# Patient Record
Sex: Female | Born: 1955 | Race: Black or African American | Hispanic: No | Marital: Married | Smoking: Former smoker
Health system: Southern US, Community
[De-identification: ages and names within clinical notes are randomized; demographics above are authoritative.]

## PROBLEM LIST (undated history)

## (undated) DIAGNOSIS — R918 Other nonspecific abnormal finding of lung field: Secondary | ICD-10-CM

## (undated) DIAGNOSIS — Z789 Other specified health status: Secondary | ICD-10-CM

## (undated) DIAGNOSIS — F17200 Nicotine dependence, unspecified, uncomplicated: Secondary | ICD-10-CM

## (undated) DIAGNOSIS — D1720 Benign lipomatous neoplasm of skin and subcutaneous tissue of unspecified limb: Secondary | ICD-10-CM

## (undated) DIAGNOSIS — I10 Essential (primary) hypertension: Secondary | ICD-10-CM

## (undated) DIAGNOSIS — F1410 Cocaine abuse, uncomplicated: Secondary | ICD-10-CM

## (undated) DIAGNOSIS — J40 Bronchitis, not specified as acute or chronic: Secondary | ICD-10-CM

## (undated) DIAGNOSIS — I499 Cardiac arrhythmia, unspecified: Secondary | ICD-10-CM

## (undated) DIAGNOSIS — Z5189 Encounter for other specified aftercare: Secondary | ICD-10-CM

## (undated) DIAGNOSIS — J449 Chronic obstructive pulmonary disease, unspecified: Secondary | ICD-10-CM

## (undated) DIAGNOSIS — R51 Headache: Secondary | ICD-10-CM

## (undated) DIAGNOSIS — M25519 Pain in unspecified shoulder: Secondary | ICD-10-CM

## (undated) DIAGNOSIS — M1990 Unspecified osteoarthritis, unspecified site: Secondary | ICD-10-CM

## (undated) DIAGNOSIS — Z720 Tobacco use: Secondary | ICD-10-CM

## (undated) DIAGNOSIS — E785 Hyperlipidemia, unspecified: Secondary | ICD-10-CM

## (undated) HISTORY — DX: Chronic obstructive pulmonary disease, unspecified: J44.9

## (undated) HISTORY — PX: FRACTURE SURGERY: SHX138

## (undated) HISTORY — PX: UTERINE FIBROID SURGERY: SHX826

## (undated) HISTORY — DX: Nicotine dependence, unspecified, uncomplicated: F17.200

---

## 2001-08-08 ENCOUNTER — Emergency Department (HOSPITAL_COMMUNITY): Admission: EM | Admit: 2001-08-08 | Discharge: 2001-08-08 | Payer: Self-pay | Admitting: Emergency Medicine

## 2002-03-08 ENCOUNTER — Inpatient Hospital Stay (HOSPITAL_COMMUNITY): Admission: EM | Admit: 2002-03-08 | Discharge: 2002-03-08 | Payer: Self-pay | Admitting: *Deleted

## 2002-03-08 ENCOUNTER — Encounter (INDEPENDENT_AMBULATORY_CARE_PROVIDER_SITE_OTHER): Payer: Self-pay | Admitting: *Deleted

## 2002-03-08 ENCOUNTER — Encounter: Payer: Self-pay | Admitting: *Deleted

## 2003-01-16 ENCOUNTER — Emergency Department (HOSPITAL_COMMUNITY): Admission: EM | Admit: 2003-01-16 | Discharge: 2003-01-16 | Payer: Self-pay

## 2004-10-09 ENCOUNTER — Ambulatory Visit: Payer: Self-pay | Admitting: Internal Medicine

## 2004-10-09 ENCOUNTER — Inpatient Hospital Stay (HOSPITAL_COMMUNITY): Admission: EM | Admit: 2004-10-09 | Discharge: 2004-10-11 | Payer: Self-pay | Admitting: Emergency Medicine

## 2004-10-20 ENCOUNTER — Ambulatory Visit: Payer: Self-pay | Admitting: Internal Medicine

## 2004-11-03 ENCOUNTER — Ambulatory Visit: Payer: Self-pay | Admitting: Internal Medicine

## 2004-11-18 ENCOUNTER — Ambulatory Visit: Payer: Self-pay | Admitting: Internal Medicine

## 2004-12-20 ENCOUNTER — Ambulatory Visit: Payer: Self-pay | Admitting: Internal Medicine

## 2004-12-30 ENCOUNTER — Ambulatory Visit: Payer: Self-pay | Admitting: Internal Medicine

## 2005-01-13 ENCOUNTER — Ambulatory Visit: Payer: Self-pay | Admitting: Internal Medicine

## 2005-03-10 ENCOUNTER — Ambulatory Visit: Payer: Self-pay | Admitting: Internal Medicine

## 2005-03-24 ENCOUNTER — Ambulatory Visit: Payer: Self-pay | Admitting: Internal Medicine

## 2005-09-06 ENCOUNTER — Ambulatory Visit: Payer: Self-pay | Admitting: Internal Medicine

## 2005-09-12 ENCOUNTER — Ambulatory Visit: Payer: Self-pay | Admitting: Hospitalist

## 2005-10-26 ENCOUNTER — Emergency Department (HOSPITAL_COMMUNITY): Admission: EM | Admit: 2005-10-26 | Discharge: 2005-10-26 | Payer: Self-pay | Admitting: Emergency Medicine

## 2006-04-17 DIAGNOSIS — I10 Essential (primary) hypertension: Secondary | ICD-10-CM | POA: Insufficient documentation

## 2006-04-17 DIAGNOSIS — F17200 Nicotine dependence, unspecified, uncomplicated: Secondary | ICD-10-CM

## 2006-04-17 DIAGNOSIS — F1420 Cocaine dependence, uncomplicated: Secondary | ICD-10-CM | POA: Insufficient documentation

## 2006-04-17 DIAGNOSIS — N951 Menopausal and female climacteric states: Secondary | ICD-10-CM | POA: Insufficient documentation

## 2006-04-17 HISTORY — DX: Nicotine dependence, unspecified, uncomplicated: F17.200

## 2009-01-26 ENCOUNTER — Emergency Department (HOSPITAL_COMMUNITY): Admission: EM | Admit: 2009-01-26 | Discharge: 2009-01-27 | Payer: Self-pay | Admitting: Emergency Medicine

## 2010-07-18 ENCOUNTER — Emergency Department (HOSPITAL_COMMUNITY)
Admission: EM | Admit: 2010-07-18 | Discharge: 2010-07-18 | Payer: Self-pay | Source: Home / Self Care | Admitting: Emergency Medicine

## 2010-07-19 LAB — POCT I-STAT, CHEM 8
Creatinine, Ser: 1.3 mg/dL — ABNORMAL HIGH (ref 0.4–1.2)
HCT: 53 % — ABNORMAL HIGH (ref 36.0–46.0)
Hemoglobin: 18 g/dL — ABNORMAL HIGH (ref 12.0–15.0)
Potassium: 3.3 mEq/L — ABNORMAL LOW (ref 3.5–5.1)
Sodium: 141 mEq/L (ref 135–145)
TCO2: 30 mmol/L (ref 0–100)

## 2010-07-19 LAB — URINALYSIS, ROUTINE W REFLEX MICROSCOPIC
Bilirubin Urine: NEGATIVE
Hgb urine dipstick: NEGATIVE
Nitrite: NEGATIVE
Protein, ur: NEGATIVE mg/dL
Specific Gravity, Urine: 1.008 (ref 1.005–1.030)
Urobilinogen, UA: 0.2 mg/dL (ref 0.0–1.0)

## 2010-07-19 LAB — URINE MICROSCOPIC-ADD ON

## 2010-10-01 LAB — POCT I-STAT, CHEM 8
BUN: 15 mg/dL (ref 6–23)
Creatinine, Ser: 1.1 mg/dL (ref 0.4–1.2)
Glucose, Bld: 131 mg/dL — ABNORMAL HIGH (ref 70–99)
Hemoglobin: 16.7 g/dL — ABNORMAL HIGH (ref 12.0–15.0)
Potassium: 2.8 mEq/L — ABNORMAL LOW (ref 3.5–5.1)
Sodium: 140 mEq/L (ref 135–145)
TCO2: 23 mmol/L (ref 0–100)

## 2010-11-11 NOTE — Discharge Summary (Signed)
   NAME:  Margaret Mcmillan, Margaret Mcmillan                          ACCOUNT NO.:  [id number]   MEDICAL RECORD NO.:  [id number]                   PATIENT TYPE:  INP   LOCATION:  9180                                 FACILITY:  [facility]   PHYSICIAN:  Katy Fitch, MD                 DATE OF BIRTH:  10/26/1955   DATE OF ADMISSION:  03/08/2002  DATE OF DISCHARGE:  03/08/2002                                 DISCHARGE SUMMARY   ADMITTING DIAGNOSES:  1. Vaginal bleeding.  2. Anemia.  3. Cervical mass suspicious for neoplasm.   DISCHARGE DIAGNOSES:  1. Vaginal bleeding.  2. Anemia.  3. Cervical mass suspicious for neoplasm.   FOLLOW-UP:  With the surgeons at [facility].   DIET:  N.p.o. at the time of discharge.   HOSPITAL COURSE:  The patient was seen in the emergency room at [facility] on the  morning of March 08, 2002 and noted to have a hemoglobin level of 4.2.  The patient states she had been bleeding for several weeks and has not been  seen by a doctor for a long time.  The patient was transferred to [facility] and upon examination the patient was noted to have a protruding  mass from her cervix.  Then, cells were taken for evaluation under  microscope.  After doing frozen section with the pathologist it was noted  that there were cells that looked atypical and suspicious for carcinoma.  At  this point the patient was explained this and she had already started having  transfusion of 2 units of blood.  The patient's course and history were  discussed with the people at [facility] and it was felt that she would  be best served by having a gynecological oncologist see the patient due to  her anemic state and possibility of having a cancerous lesion coming from  her cervix.  The patient was accepted in transfer and was sent to [facility] for evaluation and surgery.  The patient can follow up with me in the  office if needed.  The patient is to follow up with the people at [facility].   ACTIVITY:  Regular.                                               Katy Fitch, MD    DC/MEDQ  D:  04/23/2002  T:  04/24/2002  Job:  [id number]

## 2010-11-11 NOTE — H&P (Signed)
NAME:  Margaret Mcmillan, Margaret Mcmillan                          ACCOUNT NO.:  [id number]   MEDICAL RECORD NO.:  [id number]                   PATIENT TYPE:  MAT   LOCATION:  MATC                                 FACILITY:  [facility]   PHYSICIAN:  Devin M. Ciliberti, M.D.            DATE OF BIRTH:  04-18-1956   DATE OF ADMISSION:  03/08/2002  DATE OF DISCHARGE:                                HISTORY & PHYSICAL   HISTORY OF PRESENT ILLNESS:  The patient is a 55 year old G4 P4 who  presented to [facility] ER complaining of heavy vaginal bleeding which started  approximately a week ago.  The patient showed up at the ER after having  continuous bleeding and feeling lightheaded.  The patient, checked in the  ER, was noted to be orthostatic and a hemoglobin of 4.2.  The patient states  she has had normal regular periods throughout her life and this is the first  time she has ever had a heavy bleed like this.  The patient had a  coagulation profile checked that was also normal.  The patient is not  sexually active for over a year.  The patient has had no other complaints  including loss of consciousness, fevers, chest pain, or change of bowel or  bladder habits.   PAST MEDICAL HISTORY:  Hypertension.   PAST SURGICAL HISTORY:  None.   MEDICATIONS:  None.   ALLERGIES:  None.   SOCIAL HISTORY:  The patient is a half-a-pack per day smoker.   FAMILY HISTORY:  No family history of epithelial cancers.   PHYSICAL EXAMINATION:  VITAL SIGNS:  Blood pressure 144/90, pulse 103.  HEENT:  Throat clear.  LUNGS:  Clear to auscultation bilaterally.  HEART:  Regular rate and rhythm.  ABDOMEN:  Soft, nontender.  PELVIC:  Pelvic mass felt in the vaginal.  On speculum exam there is a  protruding mass coming out of the cervix of which tissue was taken.  This  could either represent a pedunculated fibroid or cervical polyp, or  potentially cervical CA.   PLAN:  The patient is already on her second unit of packed red blood cells.  She will get a CBC after her second unit of transfusion.  Will send part of  the tissue from her cervical mass to get frozen section.  If this is cancer  we will probably transport the patient to a tertiary care center with a GYN  oncologist; however, if this is not we will stabilize the patient and then  arrange for her to have a surgical exploration of this mass.  The patient  was explained this and will be followed up as soon as her labs and pathology  are back.  Devin M. Ciliberti, M.D.    DMC/MEDQ  D:  03/08/2002  T:  03/08/2002  Job:  [id number]

## 2011-03-14 ENCOUNTER — Emergency Department (HOSPITAL_COMMUNITY): Payer: Self-pay

## 2011-03-14 ENCOUNTER — Emergency Department (HOSPITAL_COMMUNITY)
Admission: EM | Admit: 2011-03-14 | Discharge: 2011-03-14 | Disposition: A | Discharge status: Home / Self Care | Payer: Self-pay | Attending: Emergency Medicine | Admitting: Emergency Medicine

## 2011-03-14 DIAGNOSIS — D170 Benign lipomatous neoplasm of skin and subcutaneous tissue of head, face and neck: Secondary | ICD-10-CM | POA: Insufficient documentation

## 2011-03-14 DIAGNOSIS — M7989 Other specified soft tissue disorders: Secondary | ICD-10-CM | POA: Insufficient documentation

## 2011-03-14 DIAGNOSIS — M25519 Pain in unspecified shoulder: Secondary | ICD-10-CM | POA: Insufficient documentation

## 2011-03-14 DIAGNOSIS — M79609 Pain in unspecified limb: Secondary | ICD-10-CM | POA: Insufficient documentation

## 2011-03-14 DIAGNOSIS — I10 Essential (primary) hypertension: Secondary | ICD-10-CM | POA: Insufficient documentation

## 2011-03-14 LAB — POCT I-STAT, CHEM 8
Calcium, Ion: 1.22 mmol/L (ref 1.12–1.32)
Chloride: 103 mEq/L (ref 96–112)
Creatinine, Ser: 1.1 mg/dL (ref 0.50–1.10)
Glucose, Bld: 82 mg/dL (ref 70–99)
Potassium: 3.6 mEq/L (ref 3.5–5.1)

## 2011-04-10 ENCOUNTER — Emergency Department (HOSPITAL_COMMUNITY)
Admission: EM | Admit: 2011-04-10 | Discharge: 2011-04-10 | Disposition: A | Discharge status: Home / Self Care | Payer: Self-pay | Attending: Emergency Medicine | Admitting: Emergency Medicine

## 2011-04-10 DIAGNOSIS — M25529 Pain in unspecified elbow: Secondary | ICD-10-CM | POA: Insufficient documentation

## 2011-04-10 DIAGNOSIS — F17200 Nicotine dependence, unspecified, uncomplicated: Secondary | ICD-10-CM | POA: Insufficient documentation

## 2011-04-10 DIAGNOSIS — M79609 Pain in unspecified limb: Secondary | ICD-10-CM | POA: Insufficient documentation

## 2011-04-10 DIAGNOSIS — G8929 Other chronic pain: Secondary | ICD-10-CM | POA: Insufficient documentation

## 2011-04-10 DIAGNOSIS — M25519 Pain in unspecified shoulder: Secondary | ICD-10-CM | POA: Insufficient documentation

## 2011-04-10 DIAGNOSIS — I10 Essential (primary) hypertension: Secondary | ICD-10-CM | POA: Insufficient documentation

## 2011-06-03 ENCOUNTER — Encounter: Payer: Self-pay | Admitting: *Deleted

## 2011-06-03 ENCOUNTER — Inpatient Hospital Stay (HOSPITAL_COMMUNITY)
Admission: EM | Admit: 2011-06-03 | Discharge: 2011-06-05 | DRG: 305 | Disposition: A | Discharge status: Home / Self Care | Payer: Self-pay | Attending: Internal Medicine | Admitting: Internal Medicine

## 2011-06-03 ENCOUNTER — Emergency Department (HOSPITAL_COMMUNITY): Payer: Self-pay

## 2011-06-03 ENCOUNTER — Other Ambulatory Visit: Payer: Self-pay

## 2011-06-03 DIAGNOSIS — F1420 Cocaine dependence, uncomplicated: Secondary | ICD-10-CM

## 2011-06-03 DIAGNOSIS — R279 Unspecified lack of coordination: Secondary | ICD-10-CM | POA: Diagnosis present

## 2011-06-03 DIAGNOSIS — M25519 Pain in unspecified shoulder: Secondary | ICD-10-CM | POA: Diagnosis present

## 2011-06-03 DIAGNOSIS — I10 Essential (primary) hypertension: Principal | ICD-10-CM | POA: Diagnosis present

## 2011-06-03 DIAGNOSIS — H538 Other visual disturbances: Secondary | ICD-10-CM | POA: Diagnosis present

## 2011-06-03 DIAGNOSIS — D1739 Benign lipomatous neoplasm of skin and subcutaneous tissue of other sites: Secondary | ICD-10-CM | POA: Diagnosis present

## 2011-06-03 DIAGNOSIS — I160 Hypertensive urgency: Secondary | ICD-10-CM

## 2011-06-03 DIAGNOSIS — Z8673 Personal history of transient ischemic attack (TIA), and cerebral infarction without residual deficits: Secondary | ICD-10-CM

## 2011-06-03 DIAGNOSIS — R51 Headache: Secondary | ICD-10-CM | POA: Diagnosis present

## 2011-06-03 DIAGNOSIS — R209 Unspecified disturbances of skin sensation: Secondary | ICD-10-CM | POA: Diagnosis present

## 2011-06-03 DIAGNOSIS — F17200 Nicotine dependence, unspecified, uncomplicated: Secondary | ICD-10-CM | POA: Diagnosis present

## 2011-06-03 HISTORY — DX: Cocaine abuse, uncomplicated: F14.10

## 2011-06-03 HISTORY — DX: Pain in unspecified shoulder: M25.519

## 2011-06-03 HISTORY — DX: Essential (primary) hypertension: I10

## 2011-06-03 HISTORY — DX: Benign lipomatous neoplasm of skin and subcutaneous tissue of unspecified limb: D17.20

## 2011-06-03 HISTORY — DX: Tobacco use: Z72.0

## 2011-06-03 HISTORY — DX: Headache: R51

## 2011-06-03 HISTORY — DX: Unspecified osteoarthritis, unspecified site: M19.90

## 2011-06-03 HISTORY — DX: Other specified health status: Z78.9

## 2011-06-03 LAB — CBC
HCT: 48 % — ABNORMAL HIGH (ref 36.0–46.0)
Hemoglobin: 16.2 g/dL — ABNORMAL HIGH (ref 12.0–15.0)
MCH: 29.3 pg (ref 26.0–34.0)
MCHC: 33.8 g/dL (ref 30.0–36.0)
RDW: 14.7 % (ref 11.5–15.5)

## 2011-06-03 LAB — URINALYSIS, ROUTINE W REFLEX MICROSCOPIC
Bilirubin Urine: NEGATIVE
Hgb urine dipstick: NEGATIVE
Ketones, ur: NEGATIVE mg/dL
Protein, ur: NEGATIVE mg/dL
Urobilinogen, UA: 0.2 mg/dL (ref 0.0–1.0)

## 2011-06-03 LAB — DIFFERENTIAL
Basophils Absolute: 0.1 10*3/uL (ref 0.0–0.1)
Basophils Relative: 1 % (ref 0–1)
Eosinophils Absolute: 0.2 10*3/uL (ref 0.0–0.7)
Eosinophils Relative: 2 % (ref 0–5)
Monocytes Absolute: 0.7 10*3/uL (ref 0.1–1.0)
Monocytes Relative: 8 % (ref 3–12)

## 2011-06-03 LAB — HEPATIC FUNCTION PANEL
Albumin: 3.8 g/dL (ref 3.5–5.2)
Total Bilirubin: 0.5 mg/dL (ref 0.3–1.2)
Total Protein: 7.5 g/dL (ref 6.0–8.3)

## 2011-06-03 LAB — POCT I-STAT, CHEM 8
Calcium, Ion: 1.18 mmol/L (ref 1.12–1.32)
Creatinine, Ser: 1.3 mg/dL — ABNORMAL HIGH (ref 0.50–1.10)
Glucose, Bld: 86 mg/dL (ref 70–99)
Hemoglobin: 17.3 g/dL — ABNORMAL HIGH (ref 12.0–15.0)
Potassium: 3.7 mEq/L (ref 3.5–5.1)
TCO2: 30 mmol/L (ref 0–100)

## 2011-06-03 MED ORDER — HYDROCHLOROTHIAZIDE 25 MG PO TABS: 25.0000 mg | ORAL_TABLET | Freq: Every day | ORAL | Status: DC

## 2011-06-03 MED ORDER — SODIUM CHLORIDE 0.9 % IJ SOLN: 3.0000 mL | INTRAMUSCULAR | Status: DC | PRN

## 2011-06-03 MED ORDER — NITROGLYCERIN IN D5W 200-5 MCG/ML-% IV SOLN
5.0000 ug/min | INTRAVENOUS | Status: DC
  Administered 2011-06-03: 5 ug/min via INTRAVENOUS

## 2011-06-03 MED ORDER — ENOXAPARIN SODIUM 40 MG/0.4ML ~~LOC~~ SOLN
40.0000 mg | SUBCUTANEOUS | Status: DC
  Administered 2011-06-03 – 2011-06-04 (×2): 40 mg via SUBCUTANEOUS
  Filled 2011-06-03 (×3): qty 0.4

## 2011-06-03 MED ORDER — SODIUM CHLORIDE 0.9 % IJ SOLN
3.0000 mL | Freq: Two times a day (BID) | INTRAMUSCULAR | Status: DC
  Administered 2011-06-03 – 2011-06-05 (×4): 3 mL via INTRAVENOUS

## 2011-06-03 MED ORDER — ACETAMINOPHEN 325 MG PO TABS: 650.0000 mg | ORAL_TABLET | Freq: Four times a day (QID) | ORAL | Status: DC | PRN

## 2011-06-03 MED ORDER — SODIUM CHLORIDE 0.9 % IV SOLN: 250.0000 mL | INTRAVENOUS | Status: DC | PRN

## 2011-06-03 MED ORDER — NITROGLYCERIN IN D5W 200-5 MCG/ML-% IV SOLN: 5.0000 ug/min | INTRAVENOUS | Status: DC

## 2011-06-03 MED ORDER — HYDROCHLOROTHIAZIDE 25 MG PO TABS
25.0000 mg | ORAL_TABLET | Freq: Once | ORAL | Status: AC
  Administered 2011-06-03: 25 mg via ORAL
  Filled 2011-06-03: qty 1

## 2011-06-03 MED ORDER — NITROGLYCERIN IN D5W 200-5 MCG/ML-% IV SOLN
INTRAVENOUS | Status: AC
  Administered 2011-06-03: 5 ug/min via INTRAVENOUS
  Filled 2011-06-03: qty 250

## 2011-06-03 NOTE — ED Notes (Signed)
Admitting MD at bedside.

## 2011-06-03 NOTE — ED Notes (Signed)
Family at bedside. 

## 2011-06-03 NOTE — ED Notes (Signed)
[id number] ready 

## 2011-06-03 NOTE — ED Notes (Signed)
Visual acuity   L- 20/15  R-20/70  BOTH-20/15

## 2011-06-03 NOTE — H&P (Signed)
Resident Addendum to Medical Student Admission H&P   I have seen and examined the patient, and agree with the the medical student assessment and plan as detailed in their separate H&P. Please see my brief note below for additional details.   CC: Elevated blood pressure  HPI:  Patient is a 55 y.o. female with a PMHx of HTN, tobacco abuse and cocaine abuse, who presents with elevated blood. Per patient, she was diagnosed with HTN since 2006. She has been taking two medications recently, including HCTZ and metoprolol.   At  this past Thursday, her PCP found her to have high BP in the clinic, about 250 SBP .  Her PCP recommended that she should go to the ER given her bp is too high, but the pt attempted to wait until another clinic appointment in this coming Monday. When the pt found out the doctor would not be able to keep the appointment on Monday, she come to the ED here. she reports that her BP was "normal" 2 years ago but do not recall it being measured.   Patient also reports having an intermittent headache. It is localized at above her eyes, 6/10 in severity, happens once every 2 weeks, lasting for various times, then subsides to lower pain level without going away completely.   She also reports in the last 2-3 weeks seeing "colors" or "vision spots" that come and go, persisting for only minutes at a time and being more common in the evening. She denies other vision changes, including blurry vision and double vision.   Of note, she has an extensive family history of HTN: mother, father, 4x brothers, and sister. She denies any h/o heart disease, any chest pain, or shortness of breath.   She also reports periodic 10/10 L shoulder pain first apparent this past July, with associated shooting pains from her elbow to her whole hand.    Home Medications: Medications Prior to Admission  Medication Dose Route Frequency Provider Last Rate Last Dose  . nitroGLYCERIN 0.2 mg/mL in dextrose 5 %  infusion  5 mcg/min Intravenous Titrated Fayrene Helper, PA 3 mL/hr at 06/03/11 1153 10 mcg/min at 06/03/11 1153    Home Medications: Metoprolol 50 mg po Bid HCTZ 25 mg, po qdaily  Allergies: No Known Allergies   Medical History:  Past Medical History  Diagnosis Date  . Hypertension     Since 2006.    Marland Kitchen Shoulder pain     Started at July, 2012   . Lipoma of shoulder    . Tobacco abuse     Since 55 year old   . Cocaine abuse      Surgical History:     Fibroid removed    Social History: Married, lives with her husband, nor working, smoked 0.5 PAD for 40 years and cut down down to 4 to 5 longer cigaret per day now, promised to quit soon. Used cocaine in the past and has not used for years. Also uses marijuana, last use is several days ago. 40 years   Family History:       Insurance: none strong family history for HTN, including mother, 4 brothers and 1 sister.  Mother and 1 sister and 1 brother have DM-II Mother has Gout Son has hirschsprung's disease    ROS: General: no fevers, chills, no changes in body weight, no changes in appetite Skin: no rash HEENT: patient report that she saw colored spots recently.  No hearing changes or sore throat Pulm: no  dyspnea, coughing, wheezing CV: no chest pain, palpitations, shortness of breath Abd: no nausea/vomiting, abdominal pain, diarrhea/constipation GU: no dysuria, hematuria, polyuria Ext: left shoulder pain,  and forearm and hand shooting pain. Neuro: no weakness, numbness, or tingling   Vital Signs:  Blood pressure 224/112, pulse 47, temperature 98.8 F (37.1 C), temperature source Oral, resp. rate 20, SpO2 97.00%.  General: resting in bed, not in acute distress HEENT: PERRL, EOMI, no scleral icterus Cardiac: S1/S2, RRR, No murmurs, gallops or rubs Pulm: Good air movement bilaterally, Clear to auscultation bilaterally, No rales, wheezing, rhonchi or rubs. Abd: Soft,  nondistended, nontender, no rebound pain, no  organomegaly, BS present Ext: tender over left shoulder. No redness and swelling around shoulder joint.  No rashes or edema, 2+DP/PT pulse bilaterally. Mass over left shoulder, about 4 X 4 cm in size, moveable.  Neuro: alert and oriented X3, cranial nerves II-XII grossly intact, muscle strength 5/5 in all extremeties,  sensation to light touch intact.    Lab results: Basic Metabolic Panel: Recent Labs  [facility] 06/03/11 1040   NA 140   K 3.7   CL 101   CO2 --   GLUCOSE 86   BUN 16   CREATININE 1.30*   CALCIUM --   MG --   PHOS --    Recent Labs  [facility] 06/03/11 1040 06/03/11 1019   WBC -- 8.6   NEUTROABS -- 4.7   HGB 17.3* 16.2*   HCT 51.0* 48.0*   MCV -- 87.0   PLT -- 174    Imaging results:  Dg Chest 2 View  06/03/2011  *RADIOLOGY REPORT*  Clinical Data: Retention, headache.  CHEST - 2 VIEW  Comparison: 07/18/2010  Findings: There is hyperinflation of the lungs compatible with COPD.  Heart is borderline in size.  Aorta is normal caliber. Stable blunting of the right costophrenic angle, likely pleural thickening/scarring.  No definite effusions.  No acute or focal airspace opacities.  No acute bony abnormality. Biapical scarring.             IMPRESSION: COPD/chronic changes.  No active disease.  Original Report Authenticated By: Cyndie Chime, M.D.   Ct Head Wo Contrast 06/03/2011  *RADIOLOGY REPORT*  Clinical Data: Hypertension, headache, blurred vision.  CT HEAD WITHOUT CONTRAST  Technique:  Contiguous axial images were obtained from the base of the skull through the vertex without contrast.  Comparison: None.  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  Patchy chronic small vessel disease throughout the deep white matter. No acute intracranial abnormality.  Specifically, no hemorrhage, hydrocephalus, mass lesion, acute infarction, or significant intracranial injury.  No acute calvarial abnormality. Visualized paranasal sinuses and mastoids clear.  Orbital soft  tissues unremarkable.   IMPRESSION: No acute intracranial abnormality.  Chronic small vessel disease.  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  Original Report Authenticated By: Cyndie Chime, M.D.     Other results:  EKG (06/03/2011) - Bradycardia,  regular rhythm and rate, normal Axis. LVH, marginal left atrial enlargement. Biphasic T waves in inferior leads and V4 to V6. Large q wave in V1 and V2 with mild elevation of ST segment.   Assessment & Plan:  #  Uncontrolled Blood pressure: Patient's systolic blood pressure is between 230 to 250 when came to ED. She dose not have obvious end organ damage signs, such as altered mental status for stroke and encephalopath, chest pain for ACS and aortic dissection, hematuria for acute renal damage. Her POCT troponin is negative.  CXR is negative for pulmonary edema. Uncontrolled blood pressure or hypertensive urgency are the most likely diagnosis. However, her EKG shows T wave changes in inferior and precordial leads in  V4 to V6. Her Cre is slightly elevated, 1.30. Her condition is somehow between hypertensive urgency and emergency.   Plan:  -patient is admitted to Telemetry bed.   -started Niro drip targeting to decrease blood pressure by 25% in few hours, tarteting SBP of 180 to 200. -will transit her IV Nitro Drip to oral medications after her Bp is established below 200. If bp is not controlled well, will consider the secondary causes such as renal artery stenosis, and familial HTN subtypes. Given strong family hx consider aldosterone and renin serum levels as inpt or outpt.  -will cycle CE X 3 -risk factor stratification: Fast lipid panel, TSH,  -will check CBC, BMP and LFT, urine analysis. -will repeat EKG at AM -consider 2-D echo   #: Shoulder pain: Most likely osteoarthritis. Will treat with tylenol.  # smoking cessation - pt appears amenable to reduction/cessation.  #: Code status - unverified during this visit.  # .DVT PPX -  Lovenox    Lorretta Harp, MD  PGY-I, Internal Medicine Resident 06/03/2011, 2:10 PM

## 2011-06-03 NOTE — ED Notes (Signed)
Patient transported to CT 

## 2011-06-03 NOTE — ED Notes (Signed)
Patient transported to X-ray 

## 2011-06-03 NOTE — ED Provider Notes (Signed)
Medical screening examination/treatment/procedure(s) were performed by non-physician practitioner and as supervising physician I was immediately available for consultation/collaboration.  Ethelda Chick, MD 06/03/11 1213

## 2011-06-03 NOTE — ED Provider Notes (Signed)
History    this is a 55 year old female with history of hypertension, presenting to the ED complaining of an elevated blood pressure. Patient states over the past several weeks she has been having blurry vision, mild headache and tingling sensation to the left hand. She has been evaluated in the ED prior due to hypertension, and was given an HCTZ and metoprolol.  She was advised to followup with a primary care doctor. She was able to see primary care doctor for the first time last week period which time it was noted that her blood pressure was elevated. Her doctor recommended for her to come to the ED for further evaluation.   Currently, patient denies hearing changes, cough, neck pain, chest pain, shortness of breath, nausea, vomiting, and diarrhea, abdominal pain, dysuria. She has been taken blood pressure medication in the past several months. She does complain of the left arm pain after recent diagnosis of a lipoma to the left shoulder several months ago. The patient associated her elevated blood pressures due to her left shoulder pain    CSN: [id number] Arrival date & time: 06/03/2011  9:21 AM   First MD Initiated Contact with Patient 06/03/11 [phone]      Chief Complaint  Patient presents with  . Hypertension    (Consider location/radiation/quality/duration/timing/severity/associated sxs/prior treatment) HPI  Past Medical History  Diagnosis Date  . Hypertension     History reviewed. No pertinent past surgical history.  History reviewed. No pertinent family history.  History  Substance Use Topics  . Smoking status: Current Everyday Smoker -- 0.5 packs/day  . Smokeless tobacco: Not on file  . Alcohol Use: No    OB History    Grav Para Term Preterm Abortions TAB SAB Ect Mult Living                  Review of Systems  All other systems reviewed and are negative.    Allergies  Review of patient's allergies indicates not on file.  Home Medications  No current outpatient  prescriptions on file.  There were no vitals taken for this visit.  Physical Exam  Nursing note and vitals reviewed. Constitutional: She is oriented to person, place, and time. She appears well-developed and well-nourished. No distress.       Awake, alert, nontoxic appearance  HENT:  Head: Normocephalic and atraumatic.  Eyes: Conjunctivae and EOM are normal. Pupils are equal, round, and reactive to light. Right eye exhibits no discharge. Left eye exhibits no discharge.  Neck: Normal range of motion. Neck supple.  Cardiovascular: Normal rate and regular rhythm.  Exam reveals no gallop and no friction rub.   No murmur heard. Pulmonary/Chest: Effort normal and breath sounds normal. No respiratory distress. She has no wheezes. She exhibits no tenderness.  Abdominal: Soft. Bowel sounds are normal. There is no tenderness. There is no rebound.  Musculoskeletal: Normal range of motion. She exhibits no tenderness.       Baseline ROM, no obvious new focal weakness  Neurological: She is alert and oriented to person, place, and time. She has normal strength. She displays a negative Romberg sign. Coordination and gait normal. GCS eye subscore is 4. GCS verbal subscore is 5. GCS motor subscore is 6.       Mental status and motor strength appears baseline for patient and situation  Skin: No rash noted.     Psychiatric: She has a normal mood and affect.    ED Course  Procedures (including critical care time)  Labs Reviewed - No data to display No results found.   No diagnosis found.   Date: 06/03/2011  Rate: 44  Rhythm: sinus bradycardia  QRS Axis: normal  Intervals: normal  ST/T Wave abnormalities: nonspecific T wave changes  Conduction Disutrbances:none  Narrative Interpretation: anterior Q waves, possibly due to LVH  Old EKG Reviewed: changes noted    MDM  Pt has uncontrolled HTN despite currently BP medications.  She is in NAD, BP 220s systolic.    10:36 AM Pt has changes to  her ECG, which signify end organ damage.  Nitro drip initiated with goal for systolic pressure around 200.  Discussed with my attending, who agrees with plan.  Will plan for admission.   11:52 AM I have consulted with Internal Medicine, who has agreed to see pt in ED and admit as appropriate.  Pt currently in NAD sts she feels better after administration of nitro drips      Fayrene Helper, PA 06/03/11 1155

## 2011-06-03 NOTE — H&P (Signed)
[facility] Admission Note Date: 06/03/2011  Patient name: Margaret [facility] Medical record number: [id number] Date of birth: Jul 07, 1955 Age: 55 y.o. Gender: female PCP: SASTRY,SANGEETA, Deactivated Not Verified  Medical Service:  Attending physician:     1st Contact:     Pager: 2nd Contact:     Pager: After 5 pm or weekends: 1st Contact:      Pager: [phone] 2nd Contact:      Pager: [phone]  Chief Complaint: "high blood pressure"  History of Present Illness: Pleasant 55 y/o woman with a h/o HTN presented to [facility] on 12/8 after her PCP found her to have high BP in the clinic this past Thursday. She was accompanied by her husband and they appear somewhat unreliable historians. The pt notes that the PCP recommended she go to the ER given how high it was, but the pt attempted to wait until another clinic appointment this coming Monday. When the pt found out the doctor would not be able to keep the appointment on Monday, she reported to the ED here. They report that her BP was "normal" 2 years ago but do not recall it being measured.  The patient endorses a 6/10 headache localized above her eyes of ~2 months duration that comes once every 2 weeks, but then recedes to a lower pain level without going away completely. She also reports in the last 2-3 weeks seeing "colors" or "vision spots" that come and go, persisting for only minutes at a time and being more common in the evening. She denies other vision changes, including blurry vision and double vision.  She does smoke approximately 4-5 long "100's" (~1.5x normal size) cigarettes a day since this past summer, down from ~0.5 pack she smoked for approximately 40 years. She has an extensive family history of HTN: mother, father, 4x brothers, and  sister. She denies any h/o heart disease, any chest pain, or shortness of breath.  She also reports periodic 10/10 L shoulder pain first apparent this past July, with associated shooting pains from her elbow to  her whole hand.  The pt reports having applied for disability, which is pending.  Meds: Medications Prior to Admission  Medication Dose Route Frequency Provider Last Rate Last Dose   nitroGLYCERIN 0.2 mg/mL in dextrose 5 % infusion  5 mcg/min Intravenous Titrated Fayrene Helper, PA 3 mL/hr at 06/03/11 1153 10 mcg/min at 06/03/11 1153   No current outpatient prescriptions on file as of 06/03/2011.    Allergies: Review of patient's allergies indicates no known allergies. Past Medical History  Diagnosis Date   Hypertension     Since 2006.    Shoulder pain     Started at July, 2012   Lipoma of shoulder    Tobacco abuse     Since 54 year old   Cocaine abuse    Past Surgical History  Procedure Date   Uterine fibroid surgery      at 2002 to 2003   Family History  Problem Relation Age of Onset   Hypertension      in mother, 4 brothers, 1 sister and  1 son   Diabetes type II      mother and 1 sister and 1 brother   Hypertension Father    Gout Mother    History   Social History   Marital Status: Married    Spouse Name: N/A    Number of Children: N/A   Years of Education: N/A   Occupational History   Not on  file.   Social History Main Topics   Smoking status: Current Everyday Smoker -- 0.5 packs/day    Types: Cigarettes   Smokeless tobacco: Not on file   Alcohol Use: No   Drug Use: No   Sexually Active: Not on file   Other Topics Concern   Not on file   Social History Narrative   No narrative on file    Review of Systems: Pertinent items are noted in HPI.  Physical Exam: Blood pressure 224/112, pulse 47, temperature 98.8 F (37.1 C), temperature source Oral, resp. rate 20, SpO2 97.00%. Gen: resting comfortably in bed in NAD HEENT: MMM, no exudate appreciated CV: RRR, with a prominent S2 appreciated, no murmurs appreciated; perhaps R carotid bruit appreciated, no renal or abd bruits appreciated Resp: CAB, no wheezes/crackles/rhonchi  appreciated Abd: +BS, soft, non-tender to deep-palpation Psych: conversation and affect appropriate to situation Neuro: Alert and conversational CN: PERRL, EOMI, vision ~20/30 in both eyes via crude bedside card, facial sens intact, brow raise + eye close + cheek puff intact, palate elev sym, should shrug 5/5, TPM Strength: 5/5 grip and extremities x 4 Sensory: to gross FT intact extremities x 4 Reflexes: 3+ bilateral biceps, triceps, patellar; 1+ bilateral achilles; no ankle clonus or hoffman's Cb: mild bilateral dysmetria on FTN pronator drift negative  Lab results: Basic Metabolic Panel:  Basename 06/03/11 1040  NA 140  K 3.7  CL 101  CO2 --  GLUCOSE 86  BUN 16  CREATININE 1.30*  CALCIUM --  MG --  PHOS --   Liver Function Tests: No results found for this basename: AST:2,ALT:2,ALKPHOS:2,BILITOT:2,PROT:2,ALBUMIN:2 in the last 72 hours No results found for this basename: LIPASE:2,AMYLASE:2 in the last 72 hours No results found for this basename: AMMONIA:2 in the last 72 hours CBC:  Basename 06/03/11 1040 06/03/11 1019  WBC -- 8.6  NEUTROABS -- 4.7  HGB 17.3* 16.2*  HCT 51.0* 48.0*  MCV -- 87.0  PLT -- 174   Cardiac Enzymes: No results found for this basename: CKTOTAL:3,CKMB:3,CKMBINDEX:3,TROPONINI:3 in the last 72 hours BNP: No results found for this basename: POCBNP:3 in the last 72 hours D-Dimer: No results found for this basename: DDIMER:2 in the last 72 hours CBG: No results found for this basename: GLUCAP:6 in the last 72 hours Hemoglobin A1C: No results found for this basename: HGBA1C in the last 72 hours Fasting Lipid Panel: No results found for this basename: CHOL,HDL,LDLCALC,TRIG,CHOLHDL,LDLDIRECT in the last 72 hours Thyroid Function Tests: No results found for this basename: TSH,T4TOTAL,FREET4,T3FREE,THYROIDAB in the last 72 hours Anemia Panel: No results found for this basename: VITAMINB12,FOLATE,FERRITIN,TIBC,IRON,RETICCTPCT in the last 72  hours Coagulation: No results found for this basename: LABPROT:2,INR:2 in the last 72 hours Urine Drug Screen: Drugs of Abuse  No results found for this basename: labopia, cocainscrnur, labbenz, amphetmu, thcu, labbarb    Alcohol Level: No results found for this basename: ETH:2 in the last 72 hours  Imaging results:  Dg Chest 2 View  06/03/2011  *RADIOLOGY REPORT*  Clinical Data: Retention, headache.  CHEST - 2 VIEW  Comparison: 07/18/2010  Findings: There is hyperinflation of the lungs compatible with COPD.  Heart is borderline in size.  Aorta is normal caliber. Stable blunting of the right costophrenic angle, likely pleural thickening/scarring.  No definite effusions.  No acute or focal airspace opacities.  No acute bony abnormality. Biapical scarring.  IMPRESSION: COPD/chronic changes.  No active disease.  Original Report Authenticated By: Cyndie Chime, M.D.   Ct Head Wo Contrast  06/03/2011  *RADIOLOGY REPORT*  Clinical Data: Hypertension, headache, blurred vision.  CT HEAD WITHOUT CONTRAST  Technique:  Contiguous axial images were obtained from the base of the skull through the vertex without contrast.  Comparison: None.  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  Patchy chronic small vessel disease throughout the deep white matter. No acute intracranial abnormality.  Specifically, no hemorrhage, hydrocephalus, mass lesion, acute infarction, or significant intracranial injury.  No acute calvarial abnormality. Visualized paranasal sinuses and mastoids clear.  Orbital soft tissues unremarkable.  IMPRESSION: No acute intracranial abnormality.  Chronic small vessel disease.  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  Original Report Authenticated By: Cyndie Chime, M.D.    Assessment & Plan by Problem: Pleasant 55 y/o woman with a h/o HTN admitted through [facility] on 12/8 for HTN at 243/118, asymptomatic aside from a mild headache.  1. HTN - uncontrolled up to 243/118 in ED. In ED  nitroglycerin drip started to target BP 180-200. Unclear baseline, though likely hypertensive for years per pt history. Home regimen of HCTZ and metoprolol. Extensive family hx w/HTN. Evidence of remote lacunar strokes on CT sans any acute process. Most likely essential hypertension exacerbated by longstanding smoking, age-associated changes, and diet. Abnl EKG with LVH, biphasic T waves in inferior leads and V4-V6, but negative troponin x 1. Remote history of cocaine use 10+ years ago (re home regimen including beta-blocker).  Consider secondary causes such as renal artery stenosis, and familial HTN subtypes. Given strong family hx consider aldosterone and renin serum levels as inpt or outpt.  d/c nitroglycerin drip w/transition to oral antihypertensives: HCTZ 25mg   telemetry with repeat morning EKG  cycle cardiac biomarkers x 3  plasma aldosterone and renin levels - inpatient now vs. outpatient  nutrition consults  moking cessation counseling  f/u with PCP re continued BP control  2. CXR consistent w/COPD - consider PFTs as outpatient  3. Elevated creatinine - unclear baseline last visits: 1.1 in 02/2011, 1.3 in 06/2010, 1.1 in 01/2009. Might reflect start of HTN-related end organ damage.  4. smoking cessation - pt appears amenable to reduction/cessation.  5. dysmetria - perhaps 2/2 to HTN (acutely vs. remotely via lacunar infarct)  6. FEN  avoid IV fluids as is possible given HTN  low salt diet as tolerated  7. dispo - once BP stabilized <200 on oral medication, if still asymptomatic consider d/c for outpt management  8. code status - unverified during this visit  Signed: Alvino Chapel 06/03/2011, 2:06 PM

## 2011-06-03 NOTE — ED Notes (Signed)
Patient denies any pain.  No neuro deficits noted.  Pt states that her B/P has been elevated off and on since July.  Pt stats that she has been taking meds for same as prescribed.  Pt went to MD office on Thur and was told to come to ED for further evaluation for same.

## 2011-06-03 NOTE — ED Notes (Signed)
MD at bedside. 

## 2011-06-03 NOTE — ED Notes (Signed)
Internal Med at bedside consulting with pt and family

## 2011-06-03 NOTE — ED Notes (Signed)
Pt states that her b/p has been elevated 2 weeks.  Pt taking her b/p meds at this time as prescribed.  Pt states that she has been seeing spots.  Pt denies any neuro deficits.  Pt denies any CP, headache or any other pain.

## 2011-06-03 NOTE — ED Notes (Signed)
Patient denies pain and is resting comfortably.  SB on monitor. Informed patient and/or family of status. Remains on NTG gtt at 5 mcg/min

## 2011-06-03 NOTE — ED Notes (Signed)
Report received, assumed care.  

## 2011-06-04 ENCOUNTER — Other Ambulatory Visit: Payer: Self-pay

## 2011-06-04 LAB — CARDIAC PANEL(CRET KIN+CKTOT+MB+TROPI)
CK, MB: 2.3 ng/mL (ref 0.3–4.0)
CK, MB: 2.5 ng/mL (ref 0.3–4.0)
Relative Index: 1.8 (ref 0.0–2.5)
Relative Index: 1.6 (ref 0.0–2.5)
Total CK: 141 U/L (ref 7–177)
Troponin I: <0.3 ng/mL (ref <0.30)

## 2011-06-04 LAB — BASIC METABOLIC PANEL
Calcium: 9.8 mg/dL (ref 8.4–10.5)
GFR calc non Af Amer: 63 mL/min — ABNORMAL LOW (ref >90)
Glucose, Bld: 92 mg/dL (ref 70–99)
Sodium: 140 mEq/L (ref 135–145)

## 2011-06-04 LAB — RAPID URINE DRUG SCREEN, HOSP PERFORMED
Amphetamines: NOT DETECTED
Benzodiazepines: NOT DETECTED
Cocaine: NOT DETECTED
Opiates: NOT DETECTED

## 2011-06-04 LAB — LIPID PANEL
LDL Cholesterol: 113 mg/dL — ABNORMAL HIGH (ref 0–99)
Total CHOL/HDL Ratio: 3 RATIO
VLDL: 12 mg/dL (ref 0–40)

## 2011-06-04 MED ORDER — AMLODIPINE BESYLATE 10 MG PO TABS
10.0000 mg | ORAL_TABLET | Freq: Every day | ORAL | Status: DC
  Administered 2011-06-04 – 2011-06-05 (×2): 10 mg via ORAL
  Filled 2011-06-04 (×2): qty 1

## 2011-06-04 MED ORDER — HYDROCHLOROTHIAZIDE 25 MG PO TABS
25.0000 mg | ORAL_TABLET | Freq: Every day | ORAL | Status: DC
  Administered 2011-06-04 – 2011-06-05 (×2): 25 mg via ORAL
  Filled 2011-06-04 (×2): qty 1

## 2011-06-04 MED ORDER — SIMVASTATIN 20 MG PO TABS
20.0000 mg | ORAL_TABLET | Freq: Every day | ORAL | Status: DC
  Administered 2011-06-04: 20 mg via ORAL
  Filled 2011-06-04 (×2): qty 1

## 2011-06-04 MED ORDER — ASPIRIN EC 81 MG PO TBEC
81.0000 mg | DELAYED_RELEASE_TABLET | Freq: Every day | ORAL | Status: DC
  Administered 2011-06-04 – 2011-06-05 (×2): 81 mg via ORAL
  Filled 2011-06-04 (×2): qty 1

## 2011-06-04 MED ORDER — DIPHENHYDRAMINE HCL 25 MG PO CAPS
50.0000 mg | ORAL_CAPSULE | Freq: Four times a day (QID) | ORAL | Status: DC | PRN
  Administered 2011-06-04: 25 mg via ORAL
  Filled 2011-06-04 (×2): qty 2

## 2011-06-04 NOTE — H&P (Signed)
Internal Medicine Teaching Service Attending Note Date: 06/04/2011  Patient name: Margaret [facility]  Medical record number: [id number]  Date of birth: 01-05-1956   I have seen and evaluated Margaret Mcmill[facility] and discussed their care with the Residency Team.  55 yo female with  Hx of cocaine use, tobacco abuse and hypertension on HCTZ and metoprolol came in with high BP, asymptomatic.  She was seen by her pcp with a SBP of 250 who recommended meds and follow up but patient unable to see PCP was concerned and came to ED.   Physical Exam: Blood pressure 211/115, pulse 54, temperature 98.2 F (36.8 C), temperature source Oral, resp. rate 18, SpO2 100.00%. BP 211/115  Pulse 54  Temp(Src) 98.2 F (36.8 C) (Oral)  Resp 18  SpO2 100% General appearance: alert and no distress Lungs: clear to auscultation bilaterally Heart: regular rate and rhythm, S1, S2 normal, no murmur, click, rub or gallop  Lab results: Results for orders placed during the hospital encounter of 06/03/11 (from the past 24 hour(s))  TSH     Status: Normal   Collection Time   06/03/11  6:46 PM      Component Value Range   TSH 2.568  0.350 - 4.500 (uIU/mL)  HEPATIC FUNCTION PANEL     Status: Normal   Collection Time   06/03/11  6:46 PM      Component Value Range   Total Protein 7.5  6.0 - 8.3 (g/dL)   Albumin 3.8  3.5 - 5.2 (g/dL)   AST 22  0 - 37 (U/L)   ALT 16  0 - 35 (U/L)   Alkaline Phosphatase 68  39 - 117 (U/L)   Total Bilirubin 0.5  0.3 - 1.2 (mg/dL)   Bilirubin, Direct <4.0  0.0 - 0.3 (mg/dL)   Indirect Bilirubin NOT CALCULATED  0.3 - 0.9 (mg/dL)  CARDIAC PANEL(CRET KIN+CKTOT+MB+TROPI)     Status: Normal   Collection Time   06/04/11 12:13 AM      Component Value Range   Total CK 147  7 - 177 (U/L)   CK, MB 2.6  0.3 - 4.0 (ng/mL)   Troponin I <0.30  <0.30 (ng/mL)   Relative Index 1.8  0.0 - 2.5   LIPID PANEL     Status: Abnormal   Collection Time   06/04/11  6:20 AM      Component Value Range   Cholesterol  187  0 - 200 (mg/dL)   Triglycerides 59  <981 (mg/dL)   HDL 62  >19 (mg/dL)   Total CHOL/HDL Ratio 3.0     VLDL 12  0 - 40 (mg/dL)   LDL Cholesterol 147 (*) 0 - 99 (mg/dL)  BASIC METABOLIC PANEL     Status: Abnormal   Collection Time   06/04/11  6:20 AM      Component Value Range   Sodium 140  135 - 145 (mEq/L)   Potassium 3.2 (*) 3.5 - 5.1 (mEq/L)   Chloride 99  96 - 112 (mEq/L)   CO2 29  19 - 32 (mEq/L)   Glucose, Bld 92  70 - 99 (mg/dL)   BUN 13  6 - 23 (mg/dL)   Creatinine, Ser 8.29  0.50 - 1.10 (mg/dL)   Calcium 9.8  8.4 - 56.2 (mg/dL)   GFR calc non Af Amer 63 (*) >90 (mL/min)   GFR calc Af Amer 73 (*) >90 (mL/min)  CARDIAC PANEL(CRET KIN+CKTOT+MB+TROPI)     Status: Normal   Collection Time  06/04/11  9:15 AM      Component Value Range   Total CK 141  7 - 177 (U/L)   CK, MB 2.3  0.3 - 4.0 (ng/mL)   Troponin I <0.30  <0.30 (ng/mL)   Relative Index 1.6  0.0 - 2.5     Imaging results:  Dg Chest 2 View  06/03/2011  *RADIOLOGY REPORT*  Clinical Data: Retention, headache.  CHEST - 2 VIEW  Comparison: 07/18/2010  Findings: There is hyperinflation of the lungs compatible with COPD.  Heart is borderline in size.  Aorta is normal caliber. Stable blunting of the right costophrenic angle, likely pleural thickening/scarring.  No definite effusions.  No acute or focal airspace opacities.  No acute bony abnormality. Biapical scarring.  IMPRESSION: COPD/chronic changes.  No active disease.  Original Report Authenticated By: Cyndie Chime, M.D.   Ct Head Wo Contrast  06/03/2011  *RADIOLOGY REPORT*  Clinical Data: Hypertension, headache, blurred vision.  CT HEAD WITHOUT CONTRAST  Technique:  Contiguous axial images were obtained from the base of the skull through the vertex without contrast.  Comparison: None.  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  Patchy chronic small vessel disease throughout the deep white matter. No acute intracranial abnormality.  Specifically, no hemorrhage,  hydrocephalus, mass lesion, acute infarction, or significant intracranial injury.  No acute calvarial abnormality. Visualized paranasal sinuses and mastoids clear.  Orbital soft tissues unremarkable.  IMPRESSION: No acute intracranial abnormality.  Chronic small vessel disease.  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  Original Report Authenticated By: Cyndie Chime, M.D.    Assessment and Plan: I agree with the formulated Assessment and Plan with the following changes: By the history, she has had long standing elevated BP, no emergency, therefore no acute indication for rapid lowering, and will aim for reduction in SBP to < 180 today and DBP to < 100 with oral meds to determine home needs.  She has been counseled on smoking cessation.  TSH is normal.  CT shows old infarct and will need aspirin for secondary prophylaxis.  This was not apparent in 2006.  Checked lipid panel for risk reduction.  Check UDS to assure no cocaine as exacerbating factor due to history.

## 2011-06-04 NOTE — Progress Notes (Signed)
Subjective: Patient feels better. Mild headache at frontal area. Slept well.   Objective: Vital signs in last 24 hours: Filed Vitals:   06/03/11 1740 06/03/11 1745 06/03/11 2100 06/04/11 0545  BP: 195/120  194/103 211/115  Pulse: 50  47 54  Temp: 98.1 F (36.7 C) 98.1 F (36.7 C) 98.1 F (36.7 C) 98.2 F (36.8 C)  TempSrc: Oral  Oral Oral  Resp: 18  18 18   SpO2: 99%  98% 100%   Weight change:   Intake/Output Summary (Last 24 hours) at 06/04/11 0644 Last data filed at 06/03/11 1521  Gross per 24 hour  Intake      0 ml  Output    170 ml  Net   -170 ml   Physical Exam:  General: resting in bed, not in acute distress HEENT: PERRL, EOMI, no scleral icterus Cardiac: S1/S2, RRR, No murmurs, gallops or rubs Pulm: Good air movement bilaterally, Clear to auscultation bilaterally, No rales, wheezing, rhonchi or rubs. Abd: Soft,  nondistended, nontender, no rebound pain, no organomegaly, BS present Ext: tender over left shoulder. No redness and swelling around shoulder joint.  No rashes or edema, 2+DP/PT pulse bilaterally. Mass over left shoulder, about 4 X 4 cm in size, moveable.  Neuro: alert and oriented X3, cranial nerves II-XII grossly intact, muscle strength 5/5 in all extremeties,  sensation to light touch intact.    Lab Results: Basic Metabolic Panel:  Lab 06/03/11 8295  NA 140  K 3.7  CL 101  CO2 --  GLUCOSE 86  BUN 16  CREATININE 1.30*  CALCIUM --  MG --  PHOS --   Liver Function Tests:  Lab 06/03/11 1846  AST 22  ALT 16  ALKPHOS 68  BILITOT 0.5  PROT 7.5  ALBUMIN 3.8   No results found for this basename: LIPASE:2,AMYLASE:2 in the last 168 hours No results found for this basename: AMMONIA:2 in the last 168 hours CBC:  Lab 06/03/11 1040 06/03/11 1019  WBC -- 8.6  NEUTROABS -- 4.7  HGB 17.3* 16.2*  HCT 51.0* 48.0*  MCV -- 87.0  PLT -- 174   Cardiac Enzymes:  Lab 06/04/11 0013  CKTOTAL 147  CKMB 2.6  CKMBINDEX --  TROPONINI <0.30   Thyroid  Function Tests:   Lab 06/03/11 1846  TSH 2.568  T4TOTAL --  FREET4 --  T3FREE --  THYROIDAB --    Micro Results: No results found for this or any previous visit (from the past 240 hour(s)). Studies/Results: Dg Chest 2 View  06/03/2011  *RADIOLOGY REPORT*  Clinical Data: Retention, headache.  CHEST - 2 VIEW  Comparison: 07/18/2010  Findings: There is hyperinflation of the lungs compatible with COPD.  Heart is borderline in size.  Aorta is normal caliber. Stable blunting of the right costophrenic angle, likely pleural thickening/scarring.  No definite effusions.  No acute or focal airspace opacities.  No acute bony abnormality. Biapical scarring.  IMPRESSION: COPD/chronic changes.  No active disease.  Original Report Authenticated By: Cyndie Chime, M.D.   Ct Head Wo Contrast  06/03/2011  *RADIOLOGY REPORT*  Clinical Data: Hypertension, headache, blurred vision.  CT HEAD WITHOUT CONTRAST  Technique:  Contiguous axial images were obtained from the base of the skull through the vertex without contrast.  Comparison: None.  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  Patchy chronic small vessel disease throughout the deep white matter. No acute intracranial abnormality.  Specifically, no hemorrhage, hydrocephalus, mass lesion, acute infarction, or significant intracranial injury.  No  acute calvarial abnormality. Visualized paranasal sinuses and mastoids clear.  Orbital soft tissues unremarkable.  IMPRESSION: No acute intracranial abnormality.  Chronic small vessel disease.  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  Original Report Authenticated By: Cyndie Chime, M.D.   Medications:  Scheduled Meds:   . enoxaparin  40 mg Subcutaneous Q24H  . hydrochlorothiazide  25 mg Oral Once  . sodium chloride  3 mL Intravenous Q12H  . DISCONTD: hydrochlorothiazide  25 mg Oral Daily   Continuous Infusions:   . DISCONTD: nitroGLYCERIN Stopped (06/03/11 1644)  . DISCONTD: nitroGLYCERIN     PRN  Meds:.sodium chloride, acetaminophen, sodium chloride Assessment/Plan:   # Uncontrolled Blood pressure: after off Nitro Drip, her Bp is 211/115. Mild headache. Troponine negative. TSH normal. Pending lipid panel and UDS.  Plan:    -will add on one of the CCB, Amlodipin, 10 mg daily. Given African American and her abnomal Cre, ACEI may not the good choice.  -will follow up the other tests for adjustment of our plan.   #: Shoulder pain: Most likely osteoarthritis. Will treat with tylenol.  # Hyperlipidemia: LD 113. Given her uncontrolled HTN and past lacunar infarction. Her goal is less than 100. Will start simvastatin.  #smoking cessation - pt appears amenable to reduction/cessation.  #: Code status - unverified during this visit.  # .DVT PPX - Lovenox              LOS: 1 day   Lorretta Harp 06/04/2011, 6:44 AM

## 2011-06-05 ENCOUNTER — Encounter (HOSPITAL_COMMUNITY): Payer: Self-pay | Admitting: *Deleted

## 2011-06-05 DIAGNOSIS — I10 Essential (primary) hypertension: Secondary | ICD-10-CM

## 2011-06-05 MED ORDER — POTASSIUM CHLORIDE CRYS ER 20 MEQ PO TBCR: 40.0000 meq | EXTENDED_RELEASE_TABLET | Freq: Once | ORAL | Status: DC

## 2011-06-05 MED ORDER — ASPIRIN 81 MG PO TBEC: 81.0000 mg | DELAYED_RELEASE_TABLET | Freq: Every day | ORAL | Status: DC

## 2011-06-05 MED ORDER — LISINOPRIL 10 MG PO TABS
10.0000 mg | ORAL_TABLET | Freq: Every day | ORAL | Status: DC
  Administered 2011-06-05: 10 mg via ORAL
  Filled 2011-06-05: qty 1

## 2011-06-05 MED ORDER — LISINOPRIL 10 MG PO TABS: 10.0000 mg | ORAL_TABLET | Freq: Every day | ORAL | Status: DC

## 2011-06-05 MED ORDER — HYDROCHLOROTHIAZIDE 25 MG PO TABS: 25.0000 mg | ORAL_TABLET | Freq: Every day | ORAL | Status: DC

## 2011-06-05 MED ORDER — SIMVASTATIN 20 MG PO TABS: 20.0000 mg | ORAL_TABLET | Freq: Every day | ORAL | Status: DC

## 2011-06-05 MED ORDER — AMLODIPINE BESYLATE 10 MG PO TABS: 10.0000 mg | ORAL_TABLET | Freq: Every day | ORAL | Status: DC

## 2011-06-05 NOTE — Discharge Summary (Signed)
Patient Name:  Margaret Mcmillan  MRN: [id number]  PCP: Jimmy Picket Not Verified  DOB:  28-Sep-1955   CSN: [id number]     Date of Admission:  06/03/2011  Date of Discharge:  06/05/2011      Attending Physician: Dr. Staci Righter, MD   DISCHARGE DIAGNOSES: Principal Problem:              *HYPERTENSION Active Problems:               CIGARETTE SMOKE   DISPOSITION AND FOLLOW-UP:  Margaret Mcmillan is to follow-up with the listed providers as detailed below. In this visit, please repeat her BMP. She had elevated Cre which resolved at discharge. Lisinopril was added on her regimen at discharge, please make sure her Cre is OK.  Follow-up Information    Follow up with PATEL,RAVI on 06/15/2011. (at 9:15)    Contact information:   [address] [place] [address] [phone]         Discharge Orders    Future Appointments: Provider: Department: Dept Phone: Center:   06/15/2011 9:15 AM [facility] [phone] [facility]     Future Orders Please Complete By Expires   Diet - low sodium heart healthy      Increase activity slowly          DISCHARGE MEDICATIONS: Discharge Medication List as of 06/05/2011 11:33 AM    START taking these medications   Details  amLODipine (NORVASC) 10 MG tablet Take 1 tablet (10 mg total) by mouth daily., Starting 06/05/2011, Until Tue 06/04/12, Print    aspirin EC 81 MG EC tablet Take 1 tablet (81 mg total) by mouth daily., Starting 06/05/2011, Until Tue 06/04/12, Print    hydrochlorothiazide (HYDRODIURIL) 25 MG tablet Take 1 tablet (25 mg total) by mouth daily., Starting 06/05/2011, Until Tue 06/04/12, Print    lisinopril (PRINIVIL,ZESTRIL) 10 MG tablet Take 1 tablet (10 mg total) by mouth daily., Starting 06/05/2011, Until Tue 06/04/12, Print    simvastatin (ZOCOR) 20 MG tablet Take 1 tablet (20 mg total) by mouth daily at 6 PM., Starting 06/05/2011, Until Tue 06/04/12, Print      STOP taking  these medications     metoprolol (LOPRESSOR) 50 MG tablet         CONSULTS: none     PROCEDURES PERFORMED:  Dg Chest 2 View  06/03/2011  *RADIOLOGY REPORT*  Clinical Data: Retention, headache.  CHEST - 2 VIEW  Comparison: 07/18/2010  Findings: There is hyperinflation of the lungs compatible with COPD.  Heart is borderline in size.  Aorta is normal caliber. Stable blunting of the right costophrenic angle, likely pleural thickening/scarring.  No definite effusions.  No acute or focal airspace opacities.  No acute bony abnormality. Biapical scarring.  IMPRESSION: COPD/chronic changes.  No active disease.  Original Report Authenticated By: Cyndie Chime, M.D.   Ct Head Wo Contrast  06/03/2011  *RADIOLOGY REPORT*  Clinical Data: Hypertension, headache, blurred vision.  CT HEAD WITHOUT CONTRAST  Technique:  Contiguous axial images were obtained from the base of the skull through the vertex without contrast.  Comparison: None.  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  Patchy chronic small vessel disease throughout the deep white matter. No acute intracranial abnormality.  Specifically, no hemorrhage, hydrocephalus, mass lesion, acute infarction, or significant intracranial injury.  No acute calvarial abnormality. Visualized paranasal sinuses and mastoids clear.  Orbital soft tissues unremarkable.  IMPRESSION: No acute intracranial  abnormality.  Chronic small vessel disease.  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  Original Report Authenticated By: Cyndie Chime, M.D.      ADMISSION DATA:  H&P: Patient is a 55 y.o. female with a PMHx of HTN, tobacco abuse and cocaine abuse, who presents with elevated blood. Per patient, she was diagnosed with HTN since 2006. She has been taking two medications recently, including HCTZ and metoprolol.   At  this past Thursday, her PCP found her to have high BP in the clinic, about 250 SBP .  Her PCP recommended that she should go to the ER given her bp is  too high, but the pt attempted to wait until another clinic appointment in this coming Monday. When the pt found out the doctor would not be able to keep the appointment on Monday, she come to the ED here. she reports that her BP was "normal" 2 years ago but do not recall it being measured.   Patient also reports having an intermittent headache. It is localized at above her eyes, 6/10 in severity, happens once every 2 weeks, lasting for various times, then subsides to lower pain level without going away completely.   She also reports in the last 2-3 weeks seeing "colors" or "vision spots" that come and go, persisting for only minutes at a time and being more common in the evening. She denies other vision changes, including blurry vision and double vision.   Of note, she has an extensive family history of HTN: mother, father, 4x brothers, and sister. She denies any h/o heart disease, any chest pain, or shortness of breath.   She also reports periodic 10/10 L shoulder pain first apparent this past July, with associated shooting pains from her elbow to her whole hand.    Physical Exam:  General: resting in bed, not in acute distress HEENT: PERRL, EOMI, no scleral icterus Cardiac: S1/S2, RRR, No murmurs, gallops or rubs Pulm: Good air movement bilaterally, Clear to auscultation bilaterally, No rales, wheezing, rhonchi or rubs. Abd: Soft,  nondistended, nontender, no rebound pain, no organomegaly, BS present Ext: tender over left shoulder. No redness and swelling around shoulder joint.  No rashes or edema, 2+DP/PT pulse bilaterally. Mass over left shoulder, about 4 X 4 cm in size, moveable.  Neuro: alert and oriented X3, cranial nerves II-XII grossly intact, muscle strength 5/5 in all extremeties,  sensation to light touch intact.   Labs:  NA  140     K  3.7     CL  101     CO2  --     GLUCOSE  86     BUN  16     CREATININE  1.30*     CALCIUM  --     MG  --     PHOS  --       Recent Labs    Basename  06/03/11 1040  06/03/11 1019     WBC  --  8.6     NEUTROABS  --  4.7     HGB  17.3*  16.2*     HCT  51.0*  48.0*     MCV  --  87.0     PLT  --  174    HOSPITAL COURSE: Present on Admission:   #. HTN - uncontrolled up to 243/118 in ED. CT on admission showed remote lacunar infarcts. In ED nitroglycerin drip started to target BP 180-200. Unclear baseline, though likely hypertensive for years per  pt history. Home regimen of HCTZ and metoprolol. Extensive family hx w/HTN. Evidence of remote lacunar strokes on CT sans any acute process. Abnl EKG with LVH, biphasic T waves in inferior leads and V4-V6, but negative troponin x 3. Was monitored on telemetry. Remote history of cocaine use 10+ years ago (re home regimen including beta-blocker; UDS was neg for cocaine). Was tapered off of nitroglycerin and started on amlodipine 10 mg and lisinopril 10 mg daily. Metoprolol held due to bradycardia and history of cocaine abuse. Started on ASA for secondary prophylaxis given remote infarct. Aside from mild bilateral frontal headache, pt remained asymptomatic during admission. On discharge BP was 162/105 with HR of 59. Most likely essential hypertension exacerbated by long-standing smoking, age-associated changes, and diet. Possibly secondary hypertension such as renal artery stenosis, and familial HTN subtypes. Given strong family hx consider aldosterone and renin serum levels as outpt.  # CXR consistent w/COPD - consider PFTs as outpatient  # Elevated creatinine - unclear baseline last visits: 1.1 in 02/2011, 1.3 in 06/2010, 1.1 in 01/2009. Might reflect start of HTN-related end organ damage. Recommend repeating metabolic panel as outpatient, particularly given initiation on lisinopril.  # smoking cessation - pt appears amenable to reduction/cessation.  # HLD - LDL of 113. Given uncontrolled HTN and remote infarct started statin with goal of <100 and even the best if <70.  # shoulder pain - treated  symptomatically w/acetaminophen   DISCHARGE DATA: Vital Signs: BP 162/105  Pulse 59  Temp(Src) 98 F (36.7 C) (Oral)  Resp 18  Ht 5\' 8"  (1.727 m)  Wt 54.432 kg (120 lb)  BMI 18.25 kg/m2  SpO2 95%  Labs: Results for orders placed during the hospital encounter of 06/03/11 (from the past 24 hour(s))  CARDIAC PANEL(CRET KIN+CKTOT+MB+TROPI)     Status: Normal   Collection Time   06/04/11  3:46 PM      Component Value Range   Total CK 148  7 - 177 (U/L)   CK, MB 2.5  0.3 - 4.0 (ng/mL)   Troponin I <0.30  <0.30 (ng/mL)   Relative Index 1.7  0.0 - 2.5   URINE RAPID DRUG SCREEN (HOSP PERFORMED)     Status: Abnormal   Collection Time   06/04/11  4:11 PM      Component Value Range   Opiates NONE DETECTED  NONE DETECTED    Cocaine NONE DETECTED  NONE DETECTED    Benzodiazepines NONE DETECTED  NONE DETECTED    Amphetamines NONE DETECTED  NONE DETECTED    Tetrahydrocannabinol POSITIVE (*) NONE DETECTED    Barbiturates NONE DETECTED  NONE DETECTED     Signed: Lorretta Harp, MD, PhD  PGY I, Internal Medicine Resident 06/05/2011, 2:30 PM

## 2011-06-05 NOTE — Progress Notes (Signed)
Subjective: Patient feels better. Asymptomatic. Slept well.  Objective: Vital signs in last 24 hours: Filed Vitals:   06/04/11 0545 06/04/11 1400 06/04/11 2100 06/05/11 0500  BP: 211/115 202/103 170/99 191/97  Pulse: 54 54 59 61  Temp: 98.2 F (36.8 C) 98.2 F (36.8 C) 97.7 F (36.5 C) 97.8 F (36.6 C)  TempSrc: Oral Oral Oral Oral  Resp: 18 17 18 18   Height:    5\' 8"  (1.727 m)  Weight:    120 lb (54.432 kg)  SpO2: 100% 99% 98% 99%   Weight change:   Intake/Output Summary (Last 24 hours) at 06/05/11 0645 Last data filed at 06/04/11 1900  Gross per 24 hour  Intake    244 ml  Output    200 ml  Net     44 ml   Physical Exam:  General: resting in bed, not in acute distress  HEENT: PERRL, EOMI, no scleral icterus  Cardiac: S1/S2, RRR, No murmurs, gallops or rubs  Pulm: Good air movement bilaterally, Clear to auscultation bilaterally, No rales, wheezing, rhonchi or rubs.  Abd: Soft, nondistended, nontender, no rebound pain, no organomegaly, BS present  Ext: mild tender over left shoulder. No redness and swelling around shoulder joint. No rashes or edema, 2+DP/PT pulse bilaterally. Mass over left shoulder, about 4 X 4 cm in size, moveable.  Neuro: alert and oriented X3, cranial nerves II-XII grossly intact, muscle strength 5/5 in all extremeties, sensation to light touch intact.    Lab Results: Basic Metabolic Panel:  Lab 06/04/11 1610 06/03/11 1040  NA 140 140  K 3.2* 3.7  CL 99 101  CO2 29 --  GLUCOSE 92 86  BUN 13 16  CREATININE 0.99 1.30*  CALCIUM 9.8 --  MG -- --  PHOS -- --   Liver Function Tests:  Lab 06/03/11 1846  AST 22  ALT 16  ALKPHOS 68  BILITOT 0.5  PROT 7.5  ALBUMIN 3.8   No results found for this basename: LIPASE:2,AMYLASE:2 in the last 168 hours No results found for this basename: AMMONIA:2 in the last 168 hours CBC:  Lab 06/03/11 1040 06/03/11 1019  WBC -- 8.6  NEUTROABS -- 4.7  HGB 17.3* 16.2*  HCT 51.0* 48.0*  MCV -- 87.0  PLT --  174   Cardiac Enzymes:  Lab 06/04/11 1546 06/04/11 0915 06/04/11 0013  CKTOTAL 148 141 147  CKMB 2.5 2.3 2.6  CKMBINDEX -- -- --  TROPONINI <0.30 <0.30 <0.30   B  Lab 06/04/11 0620  CHOL 187  HDL 62  LDLCALC 113*  TRIG 59  CHOLHDL 3.0  LDLDIRECT --   Thyroid Function Tests:  Lab 06/03/11 1846  TSH 2.568  T4TOTAL --  FREET4 --  T3FREE --  THYROIDAB --   Coagulation: No results found for this basename: LABPROT:4,INR:4 in the last 168 hours Anemia Panel: No results found for this basename: VITAMINB12,FOLATE,FERRITIN,TIBC,IRON,RETICCTPCT in the last 168 hours Urine Drug Screen: Drugs of Abuse     Component Value Date/Time   LABOPIA NONE DETECTED 06/04/2011 1611   COCAINSCRNUR NONE DETECTED 06/04/2011 1611   LABBENZ NONE DETECTED 06/04/2011 1611   AMPHETMU NONE DETECTED 06/04/2011 1611   THCU POSITIVE* 06/04/2011 1611   LABBARB NONE DETECTED 06/04/2011 1611      Micro Results: No results found for this or any previous visit (from the past 240 hour(s)). Studies/Results: Dg Chest 2 View  06/03/2011  *RADIOLOGY REPORT*  Clinical Data: Retention, headache.  CHEST - 2 VIEW  Comparison: 07/18/2010  Findings: There is hyperinflation of the lungs compatible with COPD.  Heart is borderline in size.  Aorta is normal caliber. Stable blunting of the right costophrenic angle, likely pleural thickening/scarring.  No definite effusions.  No acute or focal airspace opacities.  No acute bony abnormality. Biapical scarring.  IMPRESSION: COPD/chronic changes.  No active disease.  Original Report Authenticated By: Cyndie Chime, M.D.   Ct Head Wo Contrast  06/03/2011  *RADIOLOGY REPORT*  Clinical Data: Hypertension, headache, blurred vision.  CT HEAD WITHOUT CONTRAST  Technique:  Contiguous axial images were obtained from the base of the skull through the vertex without contrast.  Comparison: None.  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  Patchy chronic small vessel disease  throughout the deep white matter. No acute intracranial abnormality.  Specifically, no hemorrhage, hydrocephalus, mass lesion, acute infarction, or significant intracranial injury.  No acute calvarial abnormality. Visualized paranasal sinuses and mastoids clear.  Orbital soft tissues unremarkable.  IMPRESSION: No acute intracranial abnormality.  Chronic small vessel disease.  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  Original Report Authenticated By: Cyndie Chime, M.D.   Medications:  Scheduled Meds:   . amLODipine  10 mg Oral Daily  . aspirin EC  81 mg Oral Daily  . enoxaparin  40 mg Subcutaneous Q24H  . hydrochlorothiazide  25 mg Oral Daily  . potassium chloride  40 mEq Oral Once  . simvastatin  20 mg Oral q1800  . sodium chloride  3 mL Intravenous Q12H   Continuous Infusions:  PRN Meds:.sodium chloride, acetaminophen, sodium chloride, DISCONTD: diphenhydrAMINE Assessment/Plan:  # Uncontrolled Blood pressure: patient is on two medicasitons, HCTZ 25 mg daily and Amlodipine 10 mg daily. He Bp is 191/97 today. Her UDS is negative for cocaine, but positive for THC. CE negative X 3.   Plan:      -will consider to start lisinopril. He Cre is normal now. Goal is targeting her SBP to < 180 and DBP to < 100 with oral meds to determine home needs -will start ASA for secondary prophylaxis.   #: Shoulder pain: Most likely osteoarthritis. Will treat with tylenol.  # Hyperlipidemia: LDL 113. Given her uncontrolled HTN and past lacunar infarction. Her goal is less than 100. Will start simvastatin.  #smoking cessation - pt appears amenable to reduction/cessation.  #: Code status - unverified during this visit.  # .DVT PPX - Lovenox       LOS: 2 days   Lorretta Harp 06/05/2011, 6:45 AM

## 2011-06-05 NOTE — Plan of Care (Signed)
Problem: Phase III Progression Outcomes Goal: IV/normal saline lock discontinued Outcome: Not Applicable Date Met:  06/05/11 Pt for discharge

## 2011-06-15 ENCOUNTER — Ambulatory Visit (INDEPENDENT_AMBULATORY_CARE_PROVIDER_SITE_OTHER): Payer: Self-pay | Admitting: Internal Medicine

## 2011-06-15 ENCOUNTER — Encounter: Payer: Self-pay | Admitting: Internal Medicine

## 2011-06-15 VITALS — BP 164/109 | HR 59 | Temp 97.3°F | Ht 68.0 in | Wt 123.7 lb

## 2011-06-15 DIAGNOSIS — F17200 Nicotine dependence, unspecified, uncomplicated: Secondary | ICD-10-CM

## 2011-06-15 DIAGNOSIS — R918 Other nonspecific abnormal finding of lung field: Secondary | ICD-10-CM | POA: Insufficient documentation

## 2011-06-15 DIAGNOSIS — I10 Essential (primary) hypertension: Secondary | ICD-10-CM

## 2011-06-15 LAB — BASIC METABOLIC PANEL WITH GFR
BUN: 15 mg/dL (ref 6–23)
CO2: 28 mEq/L (ref 19–32)
Calcium: 9.8 mg/dL (ref 8.4–10.5)
Chloride: 97 mEq/L (ref 96–112)
Creat: 1.17 mg/dL — ABNORMAL HIGH (ref 0.50–1.10)
Glucose, Bld: 88 mg/dL (ref 70–99)
Sodium: 137 mEq/L (ref 135–145)

## 2011-06-15 MED ORDER — LISINOPRIL 20 MG PO TABS: 20.0000 mg | ORAL_TABLET | Freq: Every day | ORAL | Status: DC

## 2011-06-15 NOTE — Assessment & Plan Note (Signed)
Lab Results  Component Value Date   NA 140 06/04/2011   K 3.2* 06/04/2011   CL 99 06/04/2011   CO2 29 06/04/2011   BUN 13 06/04/2011   CREATININE 0.99 06/04/2011    BP Readings from Last 3 Encounters:  06/15/11 164/109  06/05/11 162/105    Assessment: Hypertension control:  moderately elevated  Progress toward goals:  improved Barriers to meeting goals:  nonadherence to medications and lack of understanding of disease management  Plan: Hypertension treatment:  continue current medications And increase the dose of lisinopril to 20 mg daily.

## 2011-06-15 NOTE — Patient Instructions (Addendum)
Please make a followup appointment in 2-3 weeks. Please assign PCP.  Take all your medications regularly and follow low salt diet- don't add extra salt to your diet.  I will increase the dose of lisinopril to 20 mg daily- take 2 pills until you done with the strip and then get a new prescription for [employer] for 20 mg tablets. The dose of all other medications will be same.  Will discuss about the CT scan during next visit.  Apply for orange card as soon as possible.

## 2011-06-15 NOTE — Assessment & Plan Note (Signed)
Quit smoking around 06/03/2011 after she was told that she has a pulmonary nodule suspicious for cancer.  I discussed with her about the followup of the nodule in detail.  And reiterated and reemphasized the need to stay away from smoking- as she also has emphysematous changes in her lungs.

## 2011-06-15 NOTE — Progress Notes (Signed)
  Subjective:    Patient ID: Margaret Mcmillan, female    DOB: [birth year], 55 y.o.   MRN: [id number]  HPI Margaret Mcmillan is a pleasant 55 year old woman with past with history of uncontrolled hypertension, cigarette smoking- who comes the clinic for a hospital followup visit.  She was recently admitted to hospital for uncontrolled hypertension- systolic blood pressure in 240s. She was discharged home in stable condition. Was started on Norvasc, HCTZ and lisinopril. Her LDL cholesterol was mildly elevated and was started on Zocor for that. Also she started on aspirin.  She was a patient of [facility] about 6-7 years before- but did not followup our clinic or any other physician for past 6 years also. She used to go to ER for any urgent issues.  She had a CT chest obtained in January 2012 after suspicious left upper lobe nodule seen on chest x-ray. CT scan was read as  - indeterminant pulmonary nodule within the superior  segment of the left lower lobe. Cannot rule out bronchogenic  carcinoma. Recommend follow-up non emergent PET CT for further  evaluation.  She was not aware of this until the hospital admission about a week before- which was told by the admitting resident that she has suspicious nodule in her lung. After that she stopped smoking cold [place]. She used to smoke about half pack a day for last 30 years. Has history of cocaine abuse before 10-12 years- but none since then. She admits of using marijuana- and was positive for that during hospital stay.  Otherwise she denies any fever, chills, nausea vomiting, abdominal pain, chest pain, diarrhea.  She reports taking all her medications regularly. Her husband who accompanies her, was about blood pressure machine at home.   Review of Systems    as per history of present illness, all other systems reviewed and negative. Objective:   Physical Exam  General: NAD HEENT: PERRL, EOMI, no scleral icterus Cardiac: S1, S2, RRR, no rubs, murmurs  or gallops Pulm: clear to auscultation bilaterally, moving normal volumes of air Abd: soft, nontender, nondistended, BS present Ext: warm and well perfused, no pedal edema Neuro: alert and oriented X3, cranial nerves II-XII grossly intact       Assessment & Plan:

## 2011-06-15 NOTE — Assessment & Plan Note (Signed)
As described in history of present illness, patient was found to have pulmonary nodule per CT scan in January 2012. Chest x-ray done during this admission in December 2012  does not show any worsening in size - in fact it's not mentioned in readings .  It's been almost a year from last CT scan . She does not have insurance at present .  I will talk with Dr. Josem Kaufmann for further recommendations and will review the CT scan with him. As per my assessment and after discussing with Dr. Rogelia Boga, I would wait until she gets orange card and then repeat chest CT and then see if it's resolved or follow further management after that.  And discussed this with patient and her husband in detail  and they verbalized understanding.

## 2011-06-28 ENCOUNTER — Encounter: Payer: Self-pay | Admitting: Internal Medicine

## 2011-06-28 ENCOUNTER — Ambulatory Visit (INDEPENDENT_AMBULATORY_CARE_PROVIDER_SITE_OTHER): Payer: Self-pay | Admitting: Internal Medicine

## 2011-06-28 VITALS — BP 152/96 | HR 106 | Temp 97.2°F | Ht 68.0 in | Wt 126.6 lb

## 2011-06-28 DIAGNOSIS — I10 Essential (primary) hypertension: Secondary | ICD-10-CM

## 2011-06-28 DIAGNOSIS — F17200 Nicotine dependence, unspecified, uncomplicated: Secondary | ICD-10-CM

## 2011-06-28 DIAGNOSIS — R918 Other nonspecific abnormal finding of lung field: Secondary | ICD-10-CM

## 2011-06-28 DIAGNOSIS — K5900 Constipation, unspecified: Secondary | ICD-10-CM | POA: Insufficient documentation

## 2011-06-28 NOTE — Progress Notes (Signed)
Pt aware of PET scan 07/07/11 8:30AM [facility] - NPO for 6 hours. Stanton Kidney Wong Steadham RN 06/28/11 4:20PM

## 2011-06-28 NOTE — Assessment & Plan Note (Signed)
For past 1-2 weeks as described in history of present illness.  Maybe due to Norvasc. She started over-the-counter stool softener-which is helping little bit. I advised her to take Colace other milligram by mouth twice a day if needed. If her constipation stays more than a month or 2, I will change amlodipine 2 other blood pressure pill. She verbalized understanding.

## 2011-06-28 NOTE — Assessment & Plan Note (Signed)
Lab Results  Component Value Date   NA 137 06/15/2011   K 3.5 06/15/2011   CL 97 06/15/2011   CO2 28 06/15/2011   BUN 15 06/15/2011   CREATININE 1.17* 06/15/2011   CREATININE 0.99 06/04/2011    BP Readings from Last 3 Encounters:  06/28/11 152/96  06/15/11 164/109  06/05/11 162/105    Assessment: Hypertension control:  mildly elevated  Progress toward goals:  improved Barriers to meeting goals:  no barriers identified  Plan: Hypertension treatment:  continue current medications

## 2011-06-28 NOTE — Assessment & Plan Note (Signed)
As described history of present illness- after lengthy discussion with Dr. Josem Kaufmann and radiologist- gave all the options to the patient for followup of pulmonary nodule. PET scan versus CT scan. If PET scan not hypermetabolic- will likely rule out cancer- but if shows hypermetabolic nodule, she might need surgical approach- for which she is ready.  His CT chest done now, and if it doesn't show increased size of nodule, then we can followup with CT chest again in 6 months- which is same size, can follow up in 6-12 months- which again if stable- within safely rule out malignancy. But if CT chest shows increased size- we'll have to go down the route of PET scan.  Give all the options to her and her husband- and they wanted to do PET scan and any further surgical approach as needed.  She will PET scan in 07/07/2011 at [facility]. I will make further followup as per the PET scan results.

## 2011-06-28 NOTE — Assessment & Plan Note (Signed)
Quit smoking about 4 weeks before. No cigarette smoked since then. Reemphasized the need to avoid using any cigarettes. Patient verbalized understanding.

## 2011-06-28 NOTE — Progress Notes (Signed)
  Subjective:    Patient ID: Margaret Mcmillan, female    DOB: 1956/02/25, 56 y.o.   MRN: [id number]  HPI Margaret Mcmillan is a pleasant 43 year woman with past with history of uncontrolled hypertension, pulmonary nodules diagnosed in January 2012, former cigarette smoker who comes the clinic for followup visit for blood pressure.  Pressure today is 152/96 which is better than before- though she did not take her lisinopril today, as she is out of prescription and has to fill it up today. She checks her blood pressure at home 2 to 3 times a day and it ranges between 120-140/80-90.  She stopped smoking during last hospital admission after she was told about the pulmonary nodule.  As discussed during last visit, she had left lower lobe pulmonary nodule-1.5 cm- seen in CT scan with contrast on 07/18/2010 which was not followed up.  She is accompanied by her husband today. The got orange card. I discussed with Dr. Josem Kaufmann about followup of pulmonary nodule and also with radiologist. Discussed in detail with patient and her husband about followup with CT scan or PET scan-for about more than 30 minutes.  Although patient denies any fever, chills, nausea vomiting, chest pain, short of breath, abdominal pain, headache, diarrhea.  She does complain of constipation- didn't have any bowel movement for past 48 hours. The last bowel movement consisted of very hard stool.   Review of Systems    As per history of present illness, all other systems reviewed and negative.  Objective:   Physical Exam  General: NAD HEENT: PERRL, EOMI, no scleral icterus Cardiac: S1, S2, RRR, no rubs, murmurs or gallops Pulm: clear to auscultation bilaterally, moving normal volumes of air Abd: soft, nontender, nondistended, BS present Ext: warm and well perfused, no pedal edema Neuro: alert and oriented X3, cranial nerves II-XII grossly intact       Assessment & Plan:

## 2011-06-28 NOTE — Patient Instructions (Signed)
Please make a followup appointment in 4-6 weeks.  Get the PET scan done at the given appointment time.  I will call you with the results and further followup.  For constipation- take stool softener.  If you don't feel better, take Colace 100 mg twice a day- which should be over-the-counter.  Anything else comes up meanwhile, give [place] a call.

## 2011-07-07 ENCOUNTER — Encounter (HOSPITAL_COMMUNITY)
Admission: RE | Admit: 2011-07-07 | Discharge: 2011-07-07 | Disposition: A | Discharge status: Home / Self Care | Payer: Self-pay | Source: Ambulatory Visit | Attending: Internal Medicine | Admitting: Internal Medicine

## 2011-07-07 ENCOUNTER — Encounter (HOSPITAL_COMMUNITY): Payer: Self-pay

## 2011-07-07 DIAGNOSIS — R918 Other nonspecific abnormal finding of lung field: Secondary | ICD-10-CM | POA: Insufficient documentation

## 2011-07-07 DIAGNOSIS — J984 Other disorders of lung: Secondary | ICD-10-CM | POA: Insufficient documentation

## 2011-07-07 LAB — GLUCOSE, CAPILLARY: Glucose-Capillary: 116 mg/dL — ABNORMAL HIGH (ref 70–99)

## 2011-07-07 MED ORDER — FLUDEOXYGLUCOSE F - 18 (FDG) INJECTION
19.8000 | Freq: Once | INTRAVENOUS | Status: AC | PRN
  Administered 2011-07-07: 19.8 via INTRAVENOUS

## 2011-08-01 ENCOUNTER — Other Ambulatory Visit: Payer: Self-pay | Admitting: Internal Medicine

## 2011-08-01 DIAGNOSIS — R918 Other nonspecific abnormal finding of lung field: Secondary | ICD-10-CM

## 2011-08-01 NOTE — Progress Notes (Signed)
I talked with Dr. Vassie Loll with [facility] for further management of the LUL nodule. He agreed with our management plan of nodule resection as best option- but he still needs to discuss this case in Multidisciplinary meeting as its in grey zone and there is no right answer. He is happy to see the pt. In his clinic and get PFT's and review scans with pt and decide further management. I will make an referral for Pulmonology.  Also talked with pt on phone about this plan and she agrees to see lung doctor and have surgical resection if felt needed- after discussing in detail about all options.  Also talked with Eunice Blase, RN in [facility] for getting the referral.

## 2011-08-02 ENCOUNTER — Encounter: Payer: Self-pay | Admitting: Internal Medicine

## 2011-08-02 ENCOUNTER — Telehealth: Payer: Self-pay | Admitting: *Deleted

## 2011-08-02 NOTE — Telephone Encounter (Signed)
Pt scheduled 2/26, has been moved to 2/8 @ 9:30 am.

## 2011-08-02 NOTE — Telephone Encounter (Signed)
Message copied by Julaine Hua on Wed Aug 02, 2011  5:38 PM ------      Message from: Oretha Milch      Created: Tue Aug 01, 2011  5:36 PM       JR, this was meant for you.      ----- Message -----         From: Lyn Hollingshead, MD         Sent: 08/01/2011  12:30 PM           To: Comer Locket. Vassie Loll, MD            That sounds perfect.      Will set her up as new consult.            Thanks for your quick help in managing this pt.            Ravi      ----- Message -----         From: Comer Locket Vassie Loll, MD         Sent: 08/01/2011  11:58 AM           To: Zackery Barefoot, CMA, Lyn Hollingshead, MD            Can u pl get this pt in to see me this week - new cons - ok to add on fri?            RA

## 2011-08-04 ENCOUNTER — Ambulatory Visit (INDEPENDENT_AMBULATORY_CARE_PROVIDER_SITE_OTHER): Payer: Self-pay | Admitting: Pulmonary Disease

## 2011-08-04 ENCOUNTER — Encounter: Payer: Self-pay | Admitting: Pulmonary Disease

## 2011-08-04 ENCOUNTER — Other Ambulatory Visit: Payer: Self-pay | Admitting: *Deleted

## 2011-08-04 VITALS — BP 150/80 | HR 86 | Temp 98.0°F | Ht 68.0 in | Wt 123.0 lb

## 2011-08-04 DIAGNOSIS — R918 Other nonspecific abnormal finding of lung field: Secondary | ICD-10-CM

## 2011-08-04 NOTE — Progress Notes (Signed)
  Subjective:    Patient ID: Margaret Mcmillan, female    DOB: Jan 01, 1956, 56 y.o.   MRN: [id number]  HPI 55/F, ex smoker & cocaine user, for evaluation of LUL nodule, first noted in jan'12 CT chest in jan'12  showedWithin the superior segment of the right lower lobe there is a  pulmonary nodule which measured 1.5 x 0.6 x 1.2 cm.  Small nodule is identified within the right base measuring 0.5 cm.  There are several small low density lesions within the right hepatic lobe. These are too small to characterize measuring up to 6 mm.  FU PET scan in 1/13 showed a spiculated oval nodule in the posterior left upper lobe is  unchanged in size since 07/18/2010. The lesion did demonstrate low-level F D G uptake with SUV max = 2 She smoked 40 pack years before quitting in October 2012 She denies weight loss, fevers, night sweats or hemoptysis  Past Medical History  Diagnosis Date  . Hypertension     Since 2006.   Marland Kitchen Shoulder pain     Started at July, 2012  . Lipoma of shoulder   . Tobacco abuse     Since 56 year old  . Cocaine abuse   . Headache   . Arthritis left arm  . No pertinent past medical history      Review of Systems  Constitutional: Positive for unexpected weight change. Negative for fever.  HENT: Negative for ear pain, nosebleeds, congestion, sore throat, rhinorrhea, sneezing, trouble swallowing, dental problem, postnasal drip and sinus pressure.   Eyes: Negative for redness and itching.  Respiratory: Positive for shortness of breath. Negative for cough, chest tightness and wheezing.   Cardiovascular: Negative for palpitations and leg swelling.  Gastrointestinal: Negative for nausea and vomiting.  Genitourinary: Negative for dysuria.  Musculoskeletal: Negative for joint swelling.  Skin: Negative for rash.  Neurological: Negative for headaches.  Hematological: Does not bruise/bleed easily.  Psychiatric/Behavioral: Negative for dysphoric mood. The patient is not nervous/anxious.         Objective:   Physical Exam Gen. Pleasant, thin wioman in no distress, normal affect ENT - no lesions, no post nasal drip Neck: No JVD, no thyromegaly, no carotid bruits Lungs: no use of accessory muscles, no dullness to percussion, clear without rales or rhonchi  Cardiovascular: Rhythm regular, heart sounds  normal, no murmurs or gallops, no peripheral edema Abdomen: soft and non-tender, no hepatosplenomegaly, BS normal. Musculoskeletal: No deformities, no cyanosis or clubbing Neuro:  alert, non focal        Assessment & Plan:

## 2011-08-04 NOTE — Telephone Encounter (Signed)
Pt uses the [facility] now; they need new rx's. Thanks

## 2011-08-04 NOTE — Patient Instructions (Addendum)
Schedule breathing test I will discuss with radiologist & Surgeon about the best approach for your nodule

## 2011-08-05 MED ORDER — AMLODIPINE BESYLATE 10 MG PO TABS: 10.0000 mg | ORAL_TABLET | Freq: Every day | ORAL | Status: DC

## 2011-08-05 MED ORDER — HYDROCHLOROTHIAZIDE 25 MG PO TABS: 25.0000 mg | ORAL_TABLET | Freq: Every day | ORAL | Status: DC

## 2011-08-05 MED ORDER — LISINOPRIL 20 MG PO TABS: 20.0000 mg | ORAL_TABLET | Freq: Every day | ORAL | Status: DC

## 2011-08-05 MED ORDER — SIMVASTATIN 20 MG PO TABS: 20.0000 mg | ORAL_TABLET | Freq: Every day | ORAL | Status: DC

## 2011-08-06 NOTE — Assessment & Plan Note (Signed)
I explained to the patient that no change in size over one year is a good prognostic sign. Low-grade hypermetabolic nature on that is also a good prognostic sign but does not completely rule out malignancy since bronchioloalveolar cancer can present like this. I discussed the various options including serial followup and wait and watch approach versus surgery. I explained that given the small nature of the nodule, although this could be biopsied by electro navigation bronchoscopy, negative result would not rule out malignancy. She prefers to go with the surgical approach to avoid needless anxiety. We'll proceed with pulmonary function testing and refer Margaret Mcmillan to thoracic surgery his lung function is adequate.

## 2011-08-07 NOTE — Progress Notes (Signed)
Thanks for all your help Dr. Vassie Loll.

## 2011-08-07 NOTE — Telephone Encounter (Signed)
Simvastatin, Lisinopril, HCTZ, and Amlodipine rxs called to [facility].

## 2011-08-09 ENCOUNTER — Encounter: Payer: Self-pay | Admitting: Internal Medicine

## 2011-08-09 ENCOUNTER — Ambulatory Visit (INDEPENDENT_AMBULATORY_CARE_PROVIDER_SITE_OTHER): Payer: Self-pay | Admitting: Internal Medicine

## 2011-08-09 VITALS — BP 155/109 | HR 68 | Temp 97.5°F | Ht 68.0 in | Wt 122.5 lb

## 2011-08-09 DIAGNOSIS — I10 Essential (primary) hypertension: Secondary | ICD-10-CM

## 2011-08-09 DIAGNOSIS — R911 Solitary pulmonary nodule: Secondary | ICD-10-CM

## 2011-08-09 DIAGNOSIS — R918 Other nonspecific abnormal finding of lung field: Secondary | ICD-10-CM

## 2011-08-09 DIAGNOSIS — F17200 Nicotine dependence, unspecified, uncomplicated: Secondary | ICD-10-CM

## 2011-08-09 MED ORDER — METOPROLOL TARTRATE 25 MG PO TABS: 25.0000 mg | ORAL_TABLET | Freq: Two times a day (BID) | ORAL | Status: DC

## 2011-08-09 NOTE — Assessment & Plan Note (Signed)
Quit in November/December 2012. Did not restart. Husband is very supportive for keeping her off cigarettes.

## 2011-08-09 NOTE — Assessment & Plan Note (Signed)
Blood pressure mildly elevated. She is out of Norvasc for 4-5 days. I will replace it with metoprolol 25 mg by mouth twice a day. If her blood pressure stays high at home, she will give me a call for further medication management.

## 2011-08-09 NOTE — Assessment & Plan Note (Signed)
Followed by Dr. Vassie Loll, who saw her for first time about a week before. She will have PFT on September 01 2011 and after that possible thoracic surgery refer to PFTs adequate. We'll follow Dr. Reginia Naas recommendation. Appreciate his help.

## 2011-08-09 NOTE — Progress Notes (Signed)
  Subjective:    Patient ID: Margaret Mcmillan, female    DOB: 1955/12/10, 56 y.o.   MRN: [id number]  HPI Margaret Mcmillan is a pleasant 56 year woman with past history of hypertension, about 40 year smoking history, pulmonary nodules who comes in clinic for regular followup visit.  She was referred to pulmonologist, Dr. Vassie Loll for further followup of her left upper lobe pulmonary nodule which is 1.8cm. PET scan was done in January 2013 which shows low FDG uptake and no metastasis. Considering her overall clinical picture, she would be best managed with nodule resection. She is going to get PFTs in March 1st week and then possible thoracic surgery referral. That would be arranged per Dr. Vassie Loll. I highly appreciate his help.  Her blood pressure today is 155/109. She is out of her Norvasc for 5 days now- as [facility] does not have it at present. She denies any chest pain, short of breath, abdominal pain, nausea vomiting, fever, chills, recent weight loss.  She has stopped smoking since the diagnosis of her lung nodule in November/December 2012. She has highly supportive husband who accompanies her today- and always accompanies during the clinic visit.   Review of Systems    as per history of present illness, all other systems reviewed and negative. Objective:   Physical Exam  General: NAD HEENT: PERRL, EOMI, no scleral icterus Cardiac: S1, S2, RRR, no rubs, murmurs or gallops Pulm: clear to auscultation bilaterally, moving normal volumes of air Abd: soft, nontender, nondistended, BS present Ext: warm and well perfused, no pedal edema Neuro: alert and oriented X3, cranial nerves II-XII grossly intact       Assessment & Plan:

## 2011-08-09 NOTE — Patient Instructions (Signed)
Please followup with Dr. Vassie Loll and follow his recommendations for further management.  Call the clinic and make an appointment after the possible surgical evaluation of the lung nodule.  Stop Norvasc. Start taking metoprolol 25 mg twice daily. Take all your medications regularly.  If your blood pressure stays more than 140/90 most of the time, give [place] a call for further medication changes.  Call to make an early appointment if needed meanwhile.

## 2011-08-22 ENCOUNTER — Institutional Professional Consult (permissible substitution): Payer: Self-pay | Admitting: Pulmonary Disease

## 2011-08-30 ENCOUNTER — Other Ambulatory Visit: Payer: Self-pay | Admitting: Internal Medicine

## 2011-08-30 DIAGNOSIS — I10 Essential (primary) hypertension: Secondary | ICD-10-CM

## 2011-08-30 MED ORDER — METOPROLOL SUCCINATE ER 50 MG PO TB24: 50.0000 mg | ORAL_TABLET | Freq: Every day | ORAL | Status: DC

## 2011-08-30 MED ORDER — ATORVASTATIN CALCIUM 40 MG PO TABS: 40.0000 mg | ORAL_TABLET | Freq: Every day | ORAL | Status: DC

## 2011-08-30 MED ORDER — QUINAPRIL HCL 10 MG PO TABS: 10.0000 mg | ORAL_TABLET | Freq: Every day | ORAL | Status: DC

## 2011-08-31 ENCOUNTER — Telehealth: Payer: Self-pay | Admitting: *Deleted

## 2011-08-31 MED ORDER — QUINAPRIL HCL 10 MG PO TABS: 10.0000 mg | ORAL_TABLET | Freq: Two times a day (BID) | ORAL | Status: DC

## 2011-08-31 NOTE — Telephone Encounter (Signed)
[facility] ( [place] [phone] )called with 2 questions:C On Lipitor 40 mg:  pt was on simvastatin 20 mg.  Did you want to increase it that much?  Comparable would be Lipitor 10 mg  for LDL lowering.  Lisinopril 20 mg changed  to Accupril 10 mg.  Did you decrease dose because you changed to bedtime.?

## 2011-08-31 NOTE — Telephone Encounter (Signed)
Would change Quinapril to 10 mg BID. Will keep Lipitor at 40.  Thanks.

## 2011-09-01 ENCOUNTER — Encounter: Payer: Self-pay | Admitting: Pulmonary Disease

## 2011-09-01 ENCOUNTER — Ambulatory Visit (INDEPENDENT_AMBULATORY_CARE_PROVIDER_SITE_OTHER): Payer: Self-pay | Admitting: Pulmonary Disease

## 2011-09-01 VITALS — BP 150/100 | HR 80 | Temp 98.6°F | Ht 68.0 in | Wt 127.0 lb

## 2011-09-01 DIAGNOSIS — R918 Other nonspecific abnormal finding of lung field: Secondary | ICD-10-CM

## 2011-09-01 DIAGNOSIS — J449 Chronic obstructive pulmonary disease, unspecified: Secondary | ICD-10-CM

## 2011-09-01 LAB — PULMONARY FUNCTION TEST

## 2011-09-01 NOTE — Progress Notes (Signed)
PFT done today. 

## 2011-09-01 NOTE — Patient Instructions (Signed)
We discussed your options for the nodule including surgery & observation Your lung capacity is about 65% Refer to thoracic surgeon

## 2011-09-01 NOTE — Progress Notes (Signed)
  Subjective:    Patient ID: Margaret Mcmillan, female    DOB: Dec 31, 1955, 56 y.o.   MRN: [id number]  HPI 55/F, ex smoker & cocaine user, for evaluation of LUL nodule, first noted in jan'12  CT chest in jan'12 showed a pulmonary nodule Within the superior segment of the right lower lobe  which measured 1.5 x 0.6 x 1.2 cm.  Small nodule is identified within the right base measuring 0.5 cm.  There are several small low density lesions within the right hepatic lobe. These are too small to characterize measuring up to 6 mm.  FU PET scan in 1/13 showed a spiculated oval nodule in the posterior left upper lobe is  unchanged in size since 07/18/2010. The lesion did demonstrate low-level F D G uptake with SUV max = 2  She smoked 40 pack years before quitting in October 2012   09/01/2011 No new symptoms since last visit, presents with husband PFTs >> fev1 69% -2.0L, smaller airways worse, lung volumes preserved & dlco 70% Has quit smoking & denies drug use She denies weight loss, fevers, night sweats or hemoptysis    Review of Systems Patient denies significant dyspnea,cough, hemoptysis,  chest pain, palpitations, pedal edema, orthopnea, paroxysmal nocturnal dyspnea, lightheadedness, nausea, vomiting, abdominal or  leg pains      Objective:   Physical Exam  Gen. Pleasant, well-nourished, in no distress ENT - no lesions, no post nasal drip Neck: No JVD, no thyromegaly, no carotid bruits Lungs: no use of accessory muscles, no dullness to percussion, clear without rales or rhonchi  Cardiovascular: Rhythm regular, heart sounds  normal, no murmurs or gallops, no peripheral edema Musculoskeletal: No deformities, no cyanosis or clubbing        Assessment & Plan:

## 2011-09-02 ENCOUNTER — Encounter: Payer: Self-pay | Admitting: Pulmonary Disease

## 2011-09-02 DIAGNOSIS — J449 Chronic obstructive pulmonary disease, unspecified: Secondary | ICD-10-CM

## 2011-09-02 HISTORY — DX: Chronic obstructive pulmonary disease, unspecified: J44.9

## 2011-09-02 NOTE — Assessment & Plan Note (Signed)
May need bronchodilators peri-op

## 2011-09-02 NOTE — Assessment & Plan Note (Signed)
I once again explained to the patient that no change in size over one year is a good prognostic sign. Low-grade hypermetabolic nature on pet is also a good prognostic sign but does not completely rule out malignancy since bronchioloalveolar cancer can present like this. I discussed the various options including serial followup and wait and watch approach versus surgery. She still prefers to go with the surgical approach to avoid needless anxiety. I discussed that continued observation would be a reasonable alternative.  pulmonary function is adequate and we will refer her to thoracic surgery

## 2011-09-06 ENCOUNTER — Ambulatory Visit (INDEPENDENT_AMBULATORY_CARE_PROVIDER_SITE_OTHER): Payer: Self-pay | Admitting: Internal Medicine

## 2011-09-06 ENCOUNTER — Encounter: Payer: Self-pay | Admitting: Internal Medicine

## 2011-09-06 VITALS — BP 186/126 | HR 64 | Temp 97.1°F | Ht 70.0 in | Wt 124.7 lb

## 2011-09-06 DIAGNOSIS — I10 Essential (primary) hypertension: Secondary | ICD-10-CM

## 2011-09-06 DIAGNOSIS — R918 Other nonspecific abnormal finding of lung field: Secondary | ICD-10-CM

## 2011-09-06 DIAGNOSIS — J449 Chronic obstructive pulmonary disease, unspecified: Secondary | ICD-10-CM

## 2011-09-06 MED ORDER — METOPROLOL SUCCINATE ER 100 MG PO TB24: 100.0000 mg | ORAL_TABLET | Freq: Every day | ORAL | Status: DC

## 2011-09-06 MED ORDER — HYDROCHLOROTHIAZIDE 25 MG PO TABS: 25.0000 mg | ORAL_TABLET | Freq: Every day | ORAL | Status: DC

## 2011-09-06 NOTE — Assessment & Plan Note (Signed)
Lab Results  Component Value Date   NA 137 06/15/2011   K 3.5 06/15/2011   CL 97 06/15/2011   CO2 28 06/15/2011   BUN 15 06/15/2011   CREATININE 1.17* 06/15/2011   CREATININE 0.99 06/04/2011    BP Readings from Last 3 Encounters:  09/06/11 186/126  09/01/11 150/100  08/09/11 155/109    Assessment: Hypertension control:  severely elevated  Progress toward goals:  deteriorated Barriers to meeting goals:  Recent changes in medications due to unavailability from health department and some stress for upcoming surgery appointment and possible surgery for lung nodule resection.  Plan: Hypertension treatment:  Increase the dose of metoprolol to 100 mg daily from 50 mg daily. Keep quinapril to 10 mg twice a day and HCTZ 25 mg daily. If her blood pressure stays high and needs to control before surgery, will add calcium channel blocker-likely Norvasc 10 mg daily.

## 2011-09-06 NOTE — Patient Instructions (Signed)
Please make followup appointment after surgery or before it if needed for your high blood pressure.  Start taking Metroprolol 100 mg daily instead of 50 mg. Keep taking the old medications until you complete the pill bottle- but take total 100 mg of metoprolol daily.  After that start taking quinapril 10 mg twice a day, Toprol XL 100 mg once daily and HCTZ 25 mg daily. You have to get HCTZ from [facility] which is a $4 pill as the health Department does not have it.  Call us for any further questions or concerns.

## 2011-09-06 NOTE — Assessment & Plan Note (Signed)
PFTs per Dr. Vassie Loll on 09/01/2011- FEV1 69%. As per Dr.Alva, may need perioperative bronchodilators.

## 2011-09-06 NOTE — Assessment & Plan Note (Signed)
Seen by Dr.Alva twice and had PFTs which cleared her to have surgery for nodule resection. She is going to see Dr. Dorris Fetch tomorrow at 4 PM.  If her blood pressure meds to be managed more tightly before surgery- I will recommend adding Norvasc 10 mg daily or as needed will see her back.

## 2011-09-06 NOTE — Progress Notes (Signed)
  Subjective:    Patient ID: Margaret Mcmillan, female    DOB: 11-09-1955, 56 y.o.   MRN: [id number]  HPI Margaret Mcmillan is a pleasant 56 year old woman with past history of cigarette smoking- quit in December 2012, uncontrolled hypertension, pulmonary nodules diagnosed in January 2012 who comes the clinic for followup visit and high blood pressure.  She was referred to pulmonology- Dr. Vassie Loll for her pulmonary nodules- for which she had PFTs on 09/01/2011 which cleared her to have surgical resection of a nodule- for which she is seeing Dr. Esmond Harps with [facility] tomorrow. She had recent changes in medications due to not ability of lisinopril, HCTZ and immediately his Metroprolol at health Department and so I had to change it to quinapril 10 mg twice a day, Toprol-XL 50 milligram daily and tell her to buy HCTZ 25 mg daily from [facility].  She is a little stressed out with her diagnosis of pulmonary nodule which is less likely to be cancer but cannot be ruled out for sure, and possible surgical intervention for that. She does not have any specific worries as has been discussed in detail with Dr. Vassie Loll in his clinic. I offered to answer any questions but we had detailed discussions in past and she did not have any questions. I encouraged her to ask questions to surgeon tomorrow.  She denies any headache, chest pain, short of breath, palpitations, abdominal pain, diarrhea, vision changes, dizziness.  Review of Systems    as per history of present illness, all other systems reviewed and negative. Objective:   Physical Exam  General: Mildly in distress due to worry for coming surgery. HEENT: PERRL, EOMI, no scleral icterus Cardiac: RRR, no rubs, murmurs or gallops Pulm: clear to auscultation bilaterally, moving normal volumes of air Abd: soft, nontender, nondistended, BS present Ext: warm and well perfused, no pedal edema Neuro: alert and oriented X3, cranial nerves II-XII grossly intact         Assessment & Plan:

## 2011-09-07 ENCOUNTER — Encounter: Payer: Self-pay | Admitting: Thoracic Surgery (Cardiothoracic Vascular Surgery)

## 2011-09-07 ENCOUNTER — Institutional Professional Consult (permissible substitution) (INDEPENDENT_AMBULATORY_CARE_PROVIDER_SITE_OTHER): Payer: Self-pay | Admitting: Thoracic Surgery (Cardiothoracic Vascular Surgery)

## 2011-09-07 VITALS — BP 200/100 | HR 62 | Resp 20 | Ht 70.0 in | Wt 124.0 lb

## 2011-09-07 DIAGNOSIS — R911 Solitary pulmonary nodule: Secondary | ICD-10-CM

## 2011-09-07 NOTE — Progress Notes (Signed)
PCP is Lyn Hollingshead, MD, MD Referring Provider is Oretha Milch, MD  Chief Complaint  Patient presents with  . Lung Lesion    Referral from Dr Vassie Loll for eval on Lt Upper Lung Nodule, PET Scan 07/07/11    HPI: Margaret Mcmillan is a 56 year old woman who presents with a chief complaint of a lung nodule.  She was being evaluated for cold and flulike symptoms about a year ago. A chest x-ray was done which showed a possible left upper lobe nodule. This led to a CT scan 07/18/2010 which showed an irregular nodule in the left upper lobe. She had a followup on PET CT done on January 11 13. This did not show any significant change in size the nodule, there was mild hypermetabolic activity associated with a nodule with an SUV of 2.0. She was offered continued CT followup versus wedge resection for definitive diagnosis, and strongly wishes to have the nodule out.  She states that she's been feeling well with no cough, hemoptysis, wheezing, fevers or chills. However she has been seeing Dr. Allena Katz frequently recently because of difficulties controlling her blood pressure. She saw him yesterday and her blood pressure was 180/120 and some new medication changes were initiated. She denies any headaches or chest pain. She does have a 40-pack-year history of smoking, but quit in August of 2012.  Past Medical History  Diagnosis Date  . Hypertension     Since 2006.   Marland Kitchen Shoulder pain     Started at July, 2012  . Lipoma of shoulder   . Tobacco abuse     Since 56 year old  . Cocaine abuse   . Headache   . Arthritis left arm  . No pertinent past medical history   . COPD (chronic obstructive pulmonary disease) 09/02/2011    Past Surgical History  Procedure Date  . Uterine fibroid surgery      at 2002 to 2003  . Fracture surgery left arm    Family History  Problem Relation Age of Onset  . Hypertension      in mother, 4 brothers, 1 sister and  1 son  . Diabetes type II      mother and 1 sister and 1 brother  .  Hypertension Father   . Gout Mother   . Hirschsprung's disease Son     Social History History  Substance Use Topics  . Smoking status: Former Smoker -- 1.0 packs/day for 35 years    Types: Cigarettes    Quit date: 03/27/2011  . Smokeless tobacco: Never Used  . Alcohol Use: No     rare    Current Outpatient Prescriptions  Medication Sig Dispense Refill  . aspirin EC 81 MG EC tablet Take 1 tablet (81 mg total) by mouth daily.  30 tablet  6  . atorvastatin (LIPITOR) 40 MG tablet Take 1 tablet (40 mg total) by mouth daily.  90 tablet  3  . hydrochlorothiazide (HYDRODIURIL) 25 MG tablet Take 1 tablet (25 mg total) by mouth daily.  30 tablet  6  . metoprolol succinate (TOPROL-XL) 100 MG 24 hr tablet Take 1 tablet (100 mg total) by mouth daily. Take with or immediately following a meal.  30 tablet  11  . quinapril (ACCUPRIL) 10 MG tablet Take 1 tablet (10 mg total) by mouth 2 (two) times daily.  180 tablet  3    No Known Allergies  Review of Systems  Constitutional: Negative.   HENT: Negative.   Respiratory: Negative.  Negative for cough, chest tightness, shortness of breath, wheezing and stridor.   Cardiovascular: Negative.  Negative for chest pain and leg swelling.  Gastrointestinal: Negative.   Genitourinary: Negative.   Musculoskeletal: Negative.   Neurological: Negative.   Hematological: Negative.   All other systems reviewed and are negative.    BP 200/100  Pulse 62  Resp 20  Ht 5\' 10"  (1.778 m)  Wt 124 lb (56.246 kg)  BMI 17.79 kg/m2  SpO2 100% Physical Exam  Vitals reviewed. Constitutional: She is oriented to person, place, and time. She appears well-developed. No distress.       thin  HENT:  Head: Normocephalic and atraumatic.  Eyes: EOM are normal. Pupils are equal, round, and reactive to light.  Neck: Neck supple. No tracheal deviation present. No thyromegaly present.  Cardiovascular: Normal rate, regular rhythm, normal heart sounds and intact distal  pulses.  Exam reveals no gallop and no friction rub.   No murmur heard. Pulmonary/Chest:       Faint BS bilaterally, no rales or wheezing  Abdominal: Soft. There is no tenderness.  Musculoskeletal: She exhibits no edema.  Lymphadenopathy:    She has no cervical adenopathy.  Neurological: She is alert and oriented to person, place, and time. No cranial nerve deficit.  Skin: Skin is warm and dry.     Diagnostic Tests: PET/CT 2013 and chest CT from 2012 reviewed and compared, findings as previously noted  PFT- 09/01/11-FEV1 1.96(67% predicted), FEC 3.15 (81% predicted), MVV. 96 L per minute, DLCO 70% predicted   Impression: 56 year old woman with a history of tobacco abuse who has an irregular nodule, 1.5 cm in greatest dimension, in the left upper lobe in the setting of pretty significant emphysematous changes. There is a good chance that this represents scarring or some type of chronic inflammatory process, but cancer cannot be completely ruled out.  I once again discussed with the patient and her husband the options of continued radiologic observation versus wedge resection or definitive diagnosis. While I encouraged continued observation given that this lesion has not changed in the year, she is adamant that she would like to have a nodule removed to know for sure.  I discussed with her the general nature of the operation, need for general anesthesia, incisions to be used, expected hospital stay, and overall recovery. I discussed that if the frozen section of the lesion showed it to be cancerous, I would recommend proceeding with a definitive treatment in the form of a left upper lobectomy at the same time under the same anesthetic. If the lesion were to be benign, obviously lobectomy would not be necessary.  I discussed with her husband the indications, risks, benefits, and alternatives. They understand the risks include but not limited to death, MI, DVT, PE, bleeding, possible need for  transfusions, infections, airleak (for which she is at extremely high risk) as well as other unforeseeable or unpredictable complications.  She wishes to proceed with surgery.  Her blood pressure needs to be better controlled prior to elective surgery. I will plan to see her back in about 2 weeks to check on her progress.  Plan: Left VATS, wedge resection, possible left upper lobectomy to be scheduled once hypertension is controlled.

## 2011-09-21 ENCOUNTER — Ambulatory Visit (INDEPENDENT_AMBULATORY_CARE_PROVIDER_SITE_OTHER): Payer: Self-pay | Admitting: Thoracic Surgery (Cardiothoracic Vascular Surgery)

## 2011-09-21 ENCOUNTER — Encounter: Payer: Self-pay | Admitting: Thoracic Surgery (Cardiothoracic Vascular Surgery)

## 2011-09-21 VITALS — BP 193/123 | HR 50 | Resp 16 | Ht 70.0 in | Wt 124.0 lb

## 2011-09-21 DIAGNOSIS — I10 Essential (primary) hypertension: Secondary | ICD-10-CM

## 2011-09-21 DIAGNOSIS — R911 Solitary pulmonary nodule: Secondary | ICD-10-CM

## 2011-09-21 NOTE — Progress Notes (Signed)
Patient ID: Demetri Kerman [facility], female   DOB: 02-09-1956, 56 y.o.   MRN: [id number] Margaret Mcmillan is a 56 year old woman who I saw in the office 2 weeks ago regarding a left upper lobe nodule. This is a 1.5 cm nodule in the left upper lobe, which had grown minimally over the course of the year. It had grown from about 1.3 to 1.5 cm in maximal diameter. It was mildly hypermetabolic by PET. We discussed her options and she wished to go ahead and have the nodule removed thoracoscopically, however her blood pressure was markedly elevated and we wanted to wait until that was better before scheduling surgery.  In the interim she has had medication adjustments made. She is now on Accupril 10 mg twice a day, Toprol-XL has been increased to 100 mg daily and she is on hydrochlorothiazide 25 mg daily. Due to some confusion, she had not increased her metoprolol dose to 100 mg until 2 days ago.  BP 193/123  Pulse 50  Resp 16  Ht 5\' 10"  (1.778 m)  Wt 124 lb (56.246 kg)  BMI 17.79 kg/m2  SpO2 99%   Given that her blood pressures still dangerously elevated, it would be wise to wait until that is well controlled medically before proceeding with surgery. Her nodule is only mildly hypermetabolic and exhibited only slight growth of the course of the year, so there's little, if any, downside to delaying surgery.  She checks her blood pressure at home. I advised her to continue to do this and contact Dr. Eliane Decree office if her blood pressure is not better within a week or 2.  I will plan to see her back in 3 weeks to see if she's ready for surgery at that time.

## 2011-10-02 ENCOUNTER — Encounter: Payer: Self-pay | Admitting: Internal Medicine

## 2011-10-02 ENCOUNTER — Ambulatory Visit (INDEPENDENT_AMBULATORY_CARE_PROVIDER_SITE_OTHER): Payer: Self-pay | Admitting: Internal Medicine

## 2011-10-02 VITALS — BP 201/118 | HR 81 | Temp 97.8°F | Ht 70.0 in | Wt 127.7 lb

## 2011-10-02 DIAGNOSIS — R918 Other nonspecific abnormal finding of lung field: Secondary | ICD-10-CM

## 2011-10-02 DIAGNOSIS — I10 Essential (primary) hypertension: Secondary | ICD-10-CM

## 2011-10-02 MED ORDER — AMLODIPINE BESYLATE 10 MG PO TABS: 10.0000 mg | ORAL_TABLET | Freq: Every day | ORAL | Status: DC

## 2011-10-02 MED ORDER — QUINAPRIL HCL 10 MG PO TABS: 20.0000 mg | ORAL_TABLET | Freq: Two times a day (BID) | ORAL | Status: DC

## 2011-10-02 NOTE — Assessment & Plan Note (Signed)
After consulting [facility] for availability of medications, patient will have a new regimen as follows 1. HCTZ 25 mg daily 2. Toprol XL 100 mg po daily 3. Accupril 20 mg po daily 4. Norvasc 10 mg po daily  - follow up in 2 days - pt is instructed to go to ED if any s/s end organ damage

## 2011-10-02 NOTE — Patient Instructions (Signed)
1. Please follow up in 2 days 2. Go to ED if you experience moderate to severe headache, chest pain or shortness of breath, blurry vision or weakness 3. Will add Norvasc to your regimen.

## 2011-10-02 NOTE — Assessment & Plan Note (Signed)
Pending surgical removal by TCTS after BP controlled.

## 2011-10-02 NOTE — Progress Notes (Signed)
Patient ID: Margaret Mcmillan [facility], female   DOB: 10/24/1955, 56 y.o.   MRN: [id number]  Subjective:   Patient ID: Margaret Mcmillan [facility]   DOB: October 19, 1955 56 y.o.   MRN: [id number]  HPI: Ms. Tesch is a pleasant 56 year old woman with past history of cigarette smoking- quit in December 2012, uncontrolled hypertension, pulmonary nodules diagnosed in January 2012 who comes the clinic for followup visit and high blood pressure.  1. Hypertension     She had recent changes in antihypertensive medications due to supply issues at [facility]. Her Norvasc and lisinopril were D/C''d. Quinapril 10 mg BID was added, and her Toprol-XL was increased from 50 milligram to 100 mg daily. She also gets HCTZ at [facility]. However, she reports poorly controlled BP and her BP monitor meter was not able to give her any valid reading. And she was referred by her TCTS surgeon to the clinic for better BP control.  2. Pulmonary nodule    She has had a a left upper lobe nodule, which had grown minimally over the course of the year from about 1.3 to 1.5 cm in maximal diameter. It was mildly hypermetabolic by PET. TCTS DR. Dorris Fetch was consulted and will schedule a thoracoscopic surgical removal pending better BP control.   No headache, blury vision. No shortness of breath or dyspnea on exertion. No chest pain, chest pressure or palpitation No nausea, vomiting, or abdominal pain. No melena, diarrhea or incontinence. No muscle weakness.                   Denies depression. No appetite or weight changes.      Past Medical History  Diagnosis Date  . Hypertension     Since 2006.   Marland Kitchen Shoulder pain     Started at July, 2012  . Lipoma of shoulder   . Tobacco abuse     Since 56 year old  . Cocaine abuse   . Headache   . Arthritis left arm  . No pertinent past medical history   . COPD (chronic obstructive pulmonary disease) 09/02/2011   Current Outpatient Prescriptions  Medication Sig Dispense Refill  . aspirin EC 81 MG EC  tablet Take 1 tablet (81 mg total) by mouth daily.  30 tablet  6  . atorvastatin (LIPITOR) 40 MG tablet Take 1 tablet (40 mg total) by mouth daily.  90 tablet  3  . hydrochlorothiazide (HYDRODIURIL) 25 MG tablet Take 1 tablet (25 mg total) by mouth daily.  30 tablet  6  . metoprolol succinate (TOPROL-XL) 100 MG 24 hr tablet Take 1 tablet (100 mg total) by mouth daily. Take with or immediately following a meal.  30 tablet  11  . quinapril (ACCUPRIL) 10 MG tablet Take 1 tablet (10 mg total) by mouth 2 (two) times daily.  180 tablet  3  . DISCONTD: metoprolol tartrate (LOPRESSOR) 25 MG tablet Take 1 tablet (25 mg total) by mouth 2 (two) times daily.  60 tablet  11   Family History  Problem Relation Age of Onset  . Hypertension      in mother, 4 brothers, 1 sister and  1 son  . Diabetes type II      mother and 1 sister and 1 brother  . Hypertension Father   . Gout Mother   . Hirschsprung's disease Son    History   Social History  . Marital Status: Married    Spouse Name: N/A    Number of Children:  N/A  . Years of Education: N/A   Social History Main Topics  . Smoking status: Former Smoker -- 1.0 packs/day for 35 years    Types: Cigarettes    Quit date: 03/27/2011  . Smokeless tobacco: Never Used  . Alcohol Use: No     rare  . Drug Use: No     last used last week  . Sexually Active: Yes    Birth Control/ Protection: Post-menopausal   Other Topics Concern  . None   Social History Narrative  . None   Review of Systems: See HPI Objective:  Physical Exam: Filed Vitals:   10/02/11 1455  BP: 201/118  Pulse: 81  Temp: 97.8 F (36.6 C)  TempSrc: Oral  Height: 5\' 10"  (1.778 m)  Weight: 127 lb 11.2 oz (57.924 kg)  SpO2: 100%  General: Mildly in distress due to worry for coming surgery. HEENT: PERRL, EOMI, no scleral icterus Cardiac: RRR, no rubs, murmurs or gallops Pulm: clear to auscultation bilaterally, moving normal volumes of air Abd: soft, nontender, nondistended,  BS present Ext: warm and well perfused, no pedal edema Neuro: alert and oriented X3, cranial nerves II-XII grossly intact  Assessment & Plan:

## 2011-10-03 LAB — BASIC METABOLIC PANEL WITH GFR
BUN: 14 mg/dL (ref 6–23)
Chloride: 101 mEq/L (ref 96–112)
Creat: 1.13 mg/dL — ABNORMAL HIGH (ref 0.50–1.10)
GFR, Est African American: 63 mL/min
GFR, Est Non African American: 55 mL/min — ABNORMAL LOW

## 2011-10-05 ENCOUNTER — Encounter: Payer: Self-pay | Admitting: Internal Medicine

## 2011-10-05 ENCOUNTER — Ambulatory Visit (INDEPENDENT_AMBULATORY_CARE_PROVIDER_SITE_OTHER): Payer: Self-pay | Admitting: Internal Medicine

## 2011-10-05 VITALS — BP 190/110 | HR 81 | Temp 97.1°F | Ht 70.0 in | Wt 127.0 lb

## 2011-10-05 DIAGNOSIS — I10 Essential (primary) hypertension: Secondary | ICD-10-CM

## 2011-10-05 DIAGNOSIS — IMO0001 Reserved for inherently not codable concepts without codable children: Secondary | ICD-10-CM

## 2011-10-05 DIAGNOSIS — Z558 Other problems related to education and literacy: Secondary | ICD-10-CM

## 2011-10-05 DIAGNOSIS — Z1231 Encounter for screening mammogram for malignant neoplasm of breast: Secondary | ICD-10-CM

## 2011-10-05 DIAGNOSIS — Z0000 Encounter for general adult medical examination without abnormal findings: Secondary | ICD-10-CM

## 2011-10-05 NOTE — Patient Instructions (Addendum)
Please, follow up in 3- 4 daysor sooner  For a Blood pressure recheck. Please, pick up samples of Norvasc as was instructed and call with any questions. Please, follow up with a mammogram and make an appointment for a PAP smear at the front desk. Thank you.

## 2011-10-05 NOTE — Progress Notes (Signed)
Patient ID: Margaret Mcmillan [facility], female   DOB: Feb 05, 1956, 56 y.o.   MRN: [id number] HPI:    HTN. Patient was [rescribed Norvasc through [facility]. However, medications has been ordered but not available just yet. Patient denies any HA, dizziness, SOB, CP, swelling or any other symptoms.  Review of Systems: Negative except per history of present illness  Physical Exam:  Nursing notes and vitals reviewed General:  alert, well-developed, and cooperative to examination.   Lungs:  normal respiratory effort, no accessory muscle use, normal breath sounds, no crackles, and no wheezes. Heart:  normal rate, regular rhythm, no murmurs, no gallop, and no rub.   Abdomen:  soft, non-tender, normal bowel sounds, no distention, no guarding, no rebound tenderness, no hepatomegaly, and no splenomegaly.   Extremities:  No cyanosis, clubbing, edema Neurologic:  alert & oriented X3, nonfocal exam  Meds: Medications Prior to Admission  Medication Sig Dispense Refill  . amLODipine (NORVASC) 10 MG tablet Take 1 tablet (10 mg total) by mouth daily.  30 tablet  0  . aspirin EC 81 MG EC tablet Take 1 tablet (81 mg total) by mouth daily.  30 tablet  6  . atorvastatin (LIPITOR) 40 MG tablet Take 1 tablet (40 mg total) by mouth daily.  90 tablet  3  . hydrochlorothiazide (HYDRODIURIL) 25 MG tablet Take 1 tablet (25 mg total) by mouth daily.  30 tablet  6  . metoprolol succinate (TOPROL-XL) 100 MG 24 hr tablet Take 1 tablet (100 mg total) by mouth daily. Take with or immediately following a meal.  30 tablet  11  . quinapril (ACCUPRIL) 10 MG tablet Take 2 tablets (20 mg total) by mouth 2 (two) times daily.  180 tablet  3  . DISCONTD: metoprolol tartrate (LOPRESSOR) 25 MG tablet Take 1 tablet (25 mg total) by mouth 2 (two) times daily.  60 tablet  11   No current facility-administered medications on file as of 10/05/2011.    Allergies: Review of patient's allergies indicates no known allergies. Past Medical History  Diagnosis  Date  . Hypertension     Since 2006.   Marland Kitchen Shoulder pain     Started at July, 2012  . Lipoma of shoulder   . Tobacco abuse     Since 56 year old  . Cocaine abuse   . Headache   . Arthritis left arm  . No pertinent past medical history   . COPD (chronic obstructive pulmonary disease) 09/02/2011   Past Surgical History  Procedure Date  . Uterine fibroid surgery      at 2002 to 2003  . Fracture surgery left arm   Family History  Problem Relation Age of Onset  . Hypertension      in mother, 4 brothers, 1 sister and  1 son  . Diabetes type II      mother and 1 sister and 1 brother  . Hypertension Father   . Gout Mother   . Hirschsprung's disease Son    History   Social History  . Marital Status: Married    Spouse Name: N/A    Number of Children: N/A  . Years of Education: N/A   Occupational History  . Not on file.   Social History Main Topics  . Smoking status: Former Smoker -- 1.0 packs/day for 35 years    Types: Cigarettes    Quit date: 03/27/2011  . Smokeless tobacco: Never Used  . Alcohol Use: No     rare  . Drug  Use: No     last used last week  . Sexually Active: Yes    Birth Control/ Protection: Post-menopausal   Other Topics Concern  . Not on file   Social History Narrative  . No narrative on file   A/P: 1.  HTN, uncontrolled. - naorvasc was ordered through [facility] at [facility] but not available just yet. [facility]: patient will be given samples of Norvasc this afternoon. -patient is to continue with HCTZ and Metoprolol XL in meantime  -RTC in 2-3 days for a BP recheck only. -Patient cautioned to call 911 and or go to ED if HA, visual.speech deficits, dizziness, CP, SOB, palpitations, Othropnea, PND, swelling.

## 2011-10-11 ENCOUNTER — Encounter: Payer: Self-pay | Admitting: Thoracic Surgery (Cardiothoracic Vascular Surgery)

## 2011-10-12 ENCOUNTER — Ambulatory Visit (INDEPENDENT_AMBULATORY_CARE_PROVIDER_SITE_OTHER): Payer: Self-pay | Admitting: Thoracic Surgery (Cardiothoracic Vascular Surgery)

## 2011-10-12 ENCOUNTER — Other Ambulatory Visit: Payer: Self-pay

## 2011-10-12 ENCOUNTER — Encounter: Payer: Self-pay | Admitting: Thoracic Surgery (Cardiothoracic Vascular Surgery)

## 2011-10-12 ENCOUNTER — Encounter (HOSPITAL_COMMUNITY): Payer: Self-pay | Admitting: Pharmacy Technician

## 2011-10-12 VITALS — BP 153/100 | HR 54 | Resp 18 | Ht 70.0 in | Wt 126.0 lb

## 2011-10-12 DIAGNOSIS — I10 Essential (primary) hypertension: Secondary | ICD-10-CM

## 2011-10-12 DIAGNOSIS — R911 Solitary pulmonary nodule: Secondary | ICD-10-CM

## 2011-10-12 DIAGNOSIS — D381 Neoplasm of uncertain behavior of trachea, bronchus and lung: Secondary | ICD-10-CM

## 2011-10-12 NOTE — Progress Notes (Signed)
Patient ID: Margaret Mcmillan [facility], female   DOB: 03/07/1956, 56 y.o.   MRN: [id number] Margaret Mcmillan returns today to further discuss treatment of her left upper lobe lung nodule. This nodule was first found about a year ago. It was 1.3 centimeters in diameter. She subsequently had a repeat CT year later which showed slight increase in size 1.5 cm. She had a PET scan which showed an SUV of 2. We have discussed following this versus surgical resection. Even though I encouraged following the lesion, she was adamant about having the lesion removed surgically. However her blood pressure was markedly elevated and running 190-200 systolic and 120 to 130 diastolic. And we have been waiting for blood pressure control to improve prior to scheduling surgery.  She returns today with her blood pressure improved and wishes to proceed with surgical resection.  Past Medical History  Diagnosis Date  . Hypertension     Since 2006.   Marland Kitchen Shoulder pain     Started at July, 2012  . Lipoma of shoulder   . Tobacco abuse     Since 56 year old  . Cocaine abuse   . Headache   . Arthritis left arm  . No pertinent past medical history   . COPD (chronic obstructive pulmonary disease) 09/02/2011   Current outpatient prescriptions:amLODipine (NORVASC) 10 MG tablet, Take 1 tablet (10 mg total) by mouth daily., Disp: 30 tablet, Rfl: 0;  aspirin EC 81 MG EC tablet, Take 1 tablet (81 mg total) by mouth daily., Disp: 30 tablet, Rfl: 6;  atorvastatin (LIPITOR) 40 MG tablet, Take 1 tablet (40 mg total) by mouth daily., Disp: 90 tablet, Rfl: 3 hydrochlorothiazide (HYDRODIURIL) 25 MG tablet, Take 1 tablet (25 mg total) by mouth daily., Disp: 30 tablet, Rfl: 6;  metoprolol succinate (TOPROL-XL) 100 MG 24 hr tablet, Take 1 tablet (100 mg total) by mouth daily. Take with or immediately following a meal., Disp: 30 tablet, Rfl: 11;  quinapril (ACCUPRIL) 10 MG tablet, Take 2 tablets (20 mg total) by mouth 2 (two) times daily., Disp: 180 tablet, Rfl:  3 DISCONTD: metoprolol tartrate (LOPRESSOR) 25 MG tablet, Take 1 tablet (25 mg total) by mouth 2 (two) times daily., Disp: 60 tablet, Rfl: 11  History   Social History  . Marital Status: Married    Spouse Name: N/A    Number of Children: N/A  . Years of Education: N/A   Occupational History  . Not on file.   Social History Main Topics  . Smoking status: Former Smoker -- 1.0 packs/day for 35 years    Types: Cigarettes    Quit date: 03/27/2011  . Smokeless tobacco: Never Used  . Alcohol Use: No     rare  . Drug Use: No     last used last week  . Sexually Active: Yes    Birth Control/ Protection: Post-menopausal   Other Topics Concern  . Not on file   Social History Narrative  . No narrative on file  35 pack year history of smoking quit 10/12  Review of Systems  Constitutional: Negative.   HENT: Negative.   Respiratory: Negative.   Cardiovascular: Negative.   Gastrointestinal: Negative.   Genitourinary: Negative.   Musculoskeletal: Negative.   Neurological: Negative.   Psychiatric/Behavioral: Negative.   All other systems reviewed and are negative.    Physical Exam  Constitutional: She is oriented to person, place, and time and well-developed, well-nourished, and in no distress.  HENT:  Head: Normocephalic and atraumatic.  Eyes:  EOM are normal. Pupils are equal, round, and reactive to light.  Neck: Neck supple.  Cardiovascular: Normal rate, regular rhythm and normal heart sounds.   No murmur heard. Pulmonary/Chest: Breath sounds normal. She has no wheezes. She has no rales.  Abdominal: Soft. There is no tenderness.  Lymphadenopathy:    She has no cervical adenopathy.  Neurological: She is alert and oriented to person, place, and time.  Skin: Skin is warm and dry.   Impression  56 yo female with 1.5 cm mass LUL. I reviewed our initial discussion re: serial follow up vs. Surgical resection. I once again stated that i would probably just be followed with CT.  She remains insistent that she wants the mass removed. Her BP remains elevated, but is now within range to safely proceed with left VATS if she so desires.  I have discussed with the patient the general nature of the procedure left VATS, wedge resection, possible left upper lobectomy, including the need for general anesthesia and incisions to be used. I have discussed the expected hospital stay, overall recovery and short and long term outcomes. They understand the risks include but are not limited to death, stroke, MI, DVT/PE, bleeding, possible need for transfusion, infections,other organ system dysfunction including respiratory, renal, or GI complications. She understands and accepts these risks and agrees to proceed.  Plan  Left VATS, wedge resection, possible lobectomy Friday 10/20/2011.

## 2011-10-18 ENCOUNTER — Encounter (HOSPITAL_COMMUNITY): Payer: Self-pay

## 2011-10-18 ENCOUNTER — Encounter (HOSPITAL_COMMUNITY)
Admission: RE | Admit: 2011-10-18 | Discharge: 2011-10-18 | Disposition: A | Discharge status: Home / Self Care | Payer: Self-pay | Source: Ambulatory Visit | Attending: Thoracic Surgery (Cardiothoracic Vascular Surgery) | Admitting: Thoracic Surgery (Cardiothoracic Vascular Surgery)

## 2011-10-18 VITALS — BP 127/80 | HR 69 | Temp 97.1°F | Resp 18 | Ht 68.0 in | Wt 124.6 lb

## 2011-10-18 DIAGNOSIS — D381 Neoplasm of uncertain behavior of trachea, bronchus and lung: Secondary | ICD-10-CM

## 2011-10-18 HISTORY — DX: Cardiac arrhythmia, unspecified: I49.9

## 2011-10-18 HISTORY — DX: Hyperlipidemia, unspecified: E78.5

## 2011-10-18 HISTORY — DX: Encounter for other specified aftercare: Z51.89

## 2011-10-18 HISTORY — DX: Bronchitis, not specified as acute or chronic: J40

## 2011-10-18 HISTORY — DX: Other nonspecific abnormal finding of lung field: R91.8

## 2011-10-18 LAB — COMPREHENSIVE METABOLIC PANEL
ALT: 13 U/L (ref 0–35)
AST: 20 U/L (ref 0–37)
Albumin: 3.8 g/dL (ref 3.5–5.2)
Alkaline Phosphatase: 69 U/L (ref 39–117)
BUN: 18 mg/dL (ref 6–23)
CO2: 25 mEq/L (ref 19–32)
Calcium: 9.7 mg/dL (ref 8.4–10.5)
Chloride: 102 mEq/L (ref 96–112)
Creatinine, Ser: 1.18 mg/dL — ABNORMAL HIGH (ref 0.50–1.10)
GFR calc Af Amer: 59 mL/min — ABNORMAL LOW (ref >90)
GFR calc non Af Amer: 51 mL/min — ABNORMAL LOW (ref >90)
Glucose, Bld: 108 mg/dL — ABNORMAL HIGH (ref 70–99)
Potassium: 3.2 mEq/L — ABNORMAL LOW (ref 3.5–5.1)
Sodium: 140 mEq/L (ref 135–145)
Total Bilirubin: 0.5 mg/dL (ref 0.3–1.2)
Total Protein: 7.1 g/dL (ref 6.0–8.3)

## 2011-10-18 LAB — URINALYSIS, ROUTINE W REFLEX MICROSCOPIC
Bilirubin Urine: NEGATIVE
Glucose, UA: NEGATIVE mg/dL
Hgb urine dipstick: NEGATIVE
Ketones, ur: NEGATIVE mg/dL
Leukocytes, UA: NEGATIVE
Nitrite: NEGATIVE
Protein, ur: NEGATIVE mg/dL
Specific Gravity, Urine: 1.015 (ref 1.005–1.030)
Urobilinogen, UA: 0.2 mg/dL (ref 0.0–1.0)
pH: 7 (ref 5.0–8.0)

## 2011-10-18 LAB — TYPE AND SCREEN
ABO/RH(D): O POS
Antibody Screen: NEGATIVE

## 2011-10-18 LAB — BLOOD GAS, ARTERIAL
O2 Saturation: 96.7 %
Patient temperature: 98.6
TCO2: 28.3 mmol/L (ref 0–100)
pH, Arterial: 7.431 — ABNORMAL HIGH (ref 7.350–7.400)

## 2011-10-18 LAB — APTT: aPTT: 30 seconds (ref 24–37)

## 2011-10-18 LAB — CBC
HCT: 43.4 % (ref 36.0–46.0)
Hemoglobin: 15 g/dL (ref 12.0–15.0)
MCH: 30.2 pg (ref 26.0–34.0)
MCHC: 34.6 g/dL (ref 30.0–36.0)
MCV: 87.3 fL (ref 78.0–100.0)
Platelets: 179 10*3/uL (ref 150–400)
RBC: 4.97 MIL/uL (ref 3.87–5.11)
RDW: 14 % (ref 11.5–15.5)
WBC: 8.5 10*3/uL (ref 4.0–10.5)

## 2011-10-18 LAB — SURGICAL PCR SCREEN
MRSA, PCR: NEGATIVE
Staphylococcus aureus: NEGATIVE

## 2011-10-18 LAB — PROTIME-INR
INR: 1.01 (ref 0.00–1.49)
Prothrombin Time: 13.5 seconds (ref 11.6–15.2)

## 2011-10-18 LAB — ABO/RH: ABO/RH(D): O POS

## 2011-10-18 NOTE — Pre-Procedure Instructions (Signed)
20 Margaret Mcmillan  10/18/2011   Your procedure is scheduled on:  Fri, April 26 @ 1:40PM  Report to [facility] at 0830 AM.  Call this number if you have problems the morning of surgery: [phone]   Remember:   Do not eat food:After Midnight.  May have clear liquids: up to 4 Hours before arrival.(until 4:30 am)  Clear liquids include soda, tea, black coffee, apple or grape juice, broth.,water  Take these medicines the morning of surgery with A SIP OF WATER: Amlodipine and Metoprolol   Do not wear jewelry  Do not wear lotions, powders, or perfumes.    Do not bring valuables to the hospital.  Contacts, dentures or bridgework may not be worn into surgery.  Leave suitcase in the car. After surgery it may be brought to your room.  For patients admitted to the hospital, checkout time is 11:00 AM the day of discharge.   Special Instructions: CHG Shower Use Special Wash: 1/2 bottle night before surgery and 1/2 bottle morning of surgery.   Please read over the following fact sheets that you were given: Pain Booklet, Coughing and Deep Breathing, Blood Transfusion Information, MRSA Information and Surgical Site Infection Prevention

## 2011-10-18 NOTE — Progress Notes (Signed)
Pt doesn't have a cardiologist  Denies ever having an echo/stress test/heart cath  Medical MD is Dr.Patel in [facility]internal medicine

## 2011-10-19 MED ORDER — DEXTROSE 5 % IV SOLN
1.5000 g | INTRAVENOUS | Status: AC
  Administered 2011-10-20: 1.5 g via INTRAVENOUS
  Filled 2011-10-19: qty 1.5

## 2011-10-19 NOTE — Consult Note (Signed)
Anesthesia Chart Review:  Patient is a 56 year old female scheduled for left VATS for wedge resection versus LU lobectomy for a LUL mass on 10/20/11.    History includes HTN, recent former smoker, COPD, HLD, "dysrhythmia" on metoprolol, headaches, marijuana use approximately one month ago, prior cocaine use.  She has had some significantly elevated BP earlier this month and was referred back to her PCP by Dr. Dorris Fetch, and it was down to 127/80 yesterday at PAT.  She is on Norvasc, Accupril, Toprol, and HCTZ.  Her last PCP visits were with residents Dr. Dede Query, Dr. Lyn Hollingshead, and Dr. Deatra Robinson.  Labs acceptable.    CXR shows: 1. Known left upper lobe nodule.  2. Chronic cardiomegaly. No acute cardiopulmonary abnormality.  PFT- 09/01/11-FEV1 1.96(67% predicted), FEC 3.15 (81% predicted), MVV. 96 L per minute, DLCO 70% predicted.  EKG from 10/17/11 shows SB, LVH, cannot rule out septal infarct (age undetermined), inferolateral ST/T wave abnormality--appears more non-specific--as did her EKG from September 2012.  Her EKG at the time of her admission for hypertensive crisis in December 2012 did show more prominent inferolateral T wave changes.  Cardiac enzymes were negative X 3 at that time.  I reviewed above with Anesthesiologist Dr. Jean Rosenthal.  If patient remains asymptomatic, then plan to proceed.

## 2011-10-20 ENCOUNTER — Encounter (HOSPITAL_COMMUNITY)
Admission: RE | Disposition: A | Discharge status: Home / Self Care | Payer: Self-pay | Source: Ambulatory Visit | Attending: Thoracic Surgery (Cardiothoracic Vascular Surgery)

## 2011-10-20 ENCOUNTER — Ambulatory Visit (HOSPITAL_COMMUNITY): Payer: Self-pay | Admitting: Vascular Surgery

## 2011-10-20 ENCOUNTER — Inpatient Hospital Stay (HOSPITAL_COMMUNITY): Payer: Self-pay

## 2011-10-20 ENCOUNTER — Inpatient Hospital Stay (HOSPITAL_COMMUNITY)
Admission: RE | Admit: 2011-10-20 | Discharge: 2011-11-01 | DRG: 164 | Disposition: A | Discharge status: Home / Self Care | Payer: Self-pay | Source: Ambulatory Visit | Attending: Thoracic Surgery (Cardiothoracic Vascular Surgery) | Admitting: Thoracic Surgery (Cardiothoracic Vascular Surgery)

## 2011-10-20 ENCOUNTER — Encounter (HOSPITAL_COMMUNITY): Payer: Self-pay | Admitting: Vascular Surgery

## 2011-10-20 DIAGNOSIS — I10 Essential (primary) hypertension: Secondary | ICD-10-CM | POA: Diagnosis present

## 2011-10-20 DIAGNOSIS — Y838 Other surgical procedures as the cause of abnormal reaction of the patient, or of later complication, without mention of misadventure at the time of the procedure: Secondary | ICD-10-CM | POA: Diagnosis not present

## 2011-10-20 DIAGNOSIS — Z833 Family history of diabetes mellitus: Secondary | ICD-10-CM

## 2011-10-20 DIAGNOSIS — J95812 Postprocedural air leak: Secondary | ICD-10-CM | POA: Diagnosis not present

## 2011-10-20 DIAGNOSIS — D381 Neoplasm of uncertain behavior of trachea, bronchus and lung: Secondary | ICD-10-CM

## 2011-10-20 DIAGNOSIS — F1420 Cocaine dependence, uncomplicated: Secondary | ICD-10-CM | POA: Diagnosis present

## 2011-10-20 DIAGNOSIS — K5900 Constipation, unspecified: Secondary | ICD-10-CM | POA: Diagnosis present

## 2011-10-20 DIAGNOSIS — J449 Chronic obstructive pulmonary disease, unspecified: Secondary | ICD-10-CM | POA: Diagnosis present

## 2011-10-20 DIAGNOSIS — Y9210 Unspecified residential institution as the place of occurrence of the external cause: Secondary | ICD-10-CM | POA: Diagnosis not present

## 2011-10-20 DIAGNOSIS — Z87891 Personal history of nicotine dependence: Secondary | ICD-10-CM

## 2011-10-20 DIAGNOSIS — E876 Hypokalemia: Secondary | ICD-10-CM | POA: Diagnosis present

## 2011-10-20 DIAGNOSIS — J852 Abscess of lung without pneumonia: Principal | ICD-10-CM | POA: Diagnosis present

## 2011-10-20 DIAGNOSIS — R51 Headache: Secondary | ICD-10-CM | POA: Diagnosis present

## 2011-10-20 DIAGNOSIS — M25519 Pain in unspecified shoulder: Secondary | ICD-10-CM | POA: Diagnosis present

## 2011-10-20 DIAGNOSIS — R0682 Tachypnea, not elsewhere classified: Secondary | ICD-10-CM | POA: Diagnosis not present

## 2011-10-20 DIAGNOSIS — Z780 Asymptomatic menopausal state: Secondary | ICD-10-CM

## 2011-10-20 DIAGNOSIS — G8190 Hemiplegia, unspecified affecting unspecified side: Secondary | ICD-10-CM | POA: Diagnosis not present

## 2011-10-20 DIAGNOSIS — J4489 Other specified chronic obstructive pulmonary disease: Secondary | ICD-10-CM | POA: Diagnosis present

## 2011-10-20 DIAGNOSIS — Z8249 Family history of ischemic heart disease and other diseases of the circulatory system: Secondary | ICD-10-CM

## 2011-10-20 DIAGNOSIS — R918 Other nonspecific abnormal finding of lung field: Secondary | ICD-10-CM | POA: Diagnosis present

## 2011-10-20 HISTORY — PX: LUNG LOBECTOMY: SHX167

## 2011-10-20 SURGERY — VIDEO ASSISTED THORACOSCOPY (VATS)/WEDGE RESECTION
Anesthesia: General | Site: Chest | Laterality: Left | Wound class: Clean Contaminated

## 2011-10-20 MED ORDER — ACETAMINOPHEN 10 MG/ML IV SOLN
1000.0000 mg | Freq: Four times a day (QID) | INTRAVENOUS | Status: AC
  Administered 2011-10-20 – 2011-10-21 (×4): 1000 mg via INTRAVENOUS
  Filled 2011-10-20 (×4): qty 100

## 2011-10-20 MED ORDER — ONDANSETRON HCL 4 MG/2ML IJ SOLN
INTRAMUSCULAR | Status: DC | PRN
  Administered 2011-10-20: 4 mg via INTRAVENOUS

## 2011-10-20 MED ORDER — FENTANYL CITRATE 0.05 MG/ML IJ SOLN
INTRAMUSCULAR | Status: DC | PRN
  Administered 2011-10-20 (×2): 100 ug via INTRAVENOUS
  Administered 2011-10-20: 50 ug via INTRAVENOUS

## 2011-10-20 MED ORDER — ASPIRIN EC 81 MG PO TBEC
81.0000 mg | DELAYED_RELEASE_TABLET | Freq: Every day | ORAL | Status: DC
  Administered 2011-10-21 – 2011-11-01 (×12): 81 mg via ORAL
  Filled 2011-10-20 (×12): qty 1

## 2011-10-20 MED ORDER — ASPIRIN 81 MG PO TBEC: 81.0000 mg | DELAYED_RELEASE_TABLET | Freq: Every day | ORAL | Status: DC

## 2011-10-20 MED ORDER — LACTATED RINGERS IV SOLN
INTRAVENOUS | Status: DC | PRN
  Administered 2011-10-20: 13:00:00 via INTRAVENOUS

## 2011-10-20 MED ORDER — DEXTROSE-NACL 5-0.9 % IV SOLN
INTRAVENOUS | Status: DC
  Administered 2011-10-20 – 2011-10-21 (×2): via INTRAVENOUS
  Administered 2011-10-30: 20 mL via INTRAVENOUS

## 2011-10-20 MED ORDER — SENNOSIDES-DOCUSATE SODIUM 8.6-50 MG PO TABS
1.0000 | ORAL_TABLET | Freq: Every evening | ORAL | Status: DC | PRN
  Filled 2011-10-20: qty 1

## 2011-10-20 MED ORDER — DIPHENHYDRAMINE HCL 50 MG/ML IJ SOLN: 12.5000 mg | Freq: Four times a day (QID) | INTRAMUSCULAR | Status: DC | PRN

## 2011-10-20 MED ORDER — HYDROMORPHONE HCL PF 1 MG/ML IJ SOLN
0.2500 mg | INTRAMUSCULAR | Status: DC | PRN
  Administered 2011-10-20 (×2): 0.5 mg via INTRAVENOUS

## 2011-10-20 MED ORDER — OXYCODONE-ACETAMINOPHEN 5-325 MG PO TABS
1.0000 | ORAL_TABLET | ORAL | Status: DC | PRN
  Administered 2011-10-26 – 2011-10-31 (×4): 1 via ORAL
  Administered 2011-11-01: 2 via ORAL
  Administered 2011-11-01: 1 via ORAL
  Filled 2011-10-20 (×4): qty 1
  Filled 2011-10-20: qty 2

## 2011-10-20 MED ORDER — TRAMADOL HCL 50 MG PO TABS: 50.0000 mg | ORAL_TABLET | Freq: Four times a day (QID) | ORAL | Status: DC | PRN

## 2011-10-20 MED ORDER — DEXTROSE 5 % IV SOLN
1.5000 g | Freq: Two times a day (BID) | INTRAVENOUS | Status: AC
  Administered 2011-10-20 – 2011-10-21 (×2): 1.5 g via INTRAVENOUS
  Filled 2011-10-20 (×2): qty 1.5

## 2011-10-20 MED ORDER — ONDANSETRON HCL 4 MG/2ML IJ SOLN
4.0000 mg | Freq: Four times a day (QID) | INTRAMUSCULAR | Status: DC | PRN
  Filled 2011-10-20: qty 2

## 2011-10-20 MED ORDER — POTASSIUM CHLORIDE 10 MEQ/50ML IV SOLN
10.0000 meq | Freq: Every day | INTRAVENOUS | Status: DC | PRN
  Administered 2011-10-22 (×3): 10 meq via INTRAVENOUS
  Filled 2011-10-20: qty 150

## 2011-10-20 MED ORDER — ATORVASTATIN CALCIUM 40 MG PO TABS
40.0000 mg | ORAL_TABLET | Freq: Every day | ORAL | Status: DC
  Administered 2011-10-21 – 2011-10-31 (×11): 40 mg via ORAL
  Filled 2011-10-20 (×12): qty 1

## 2011-10-20 MED ORDER — AMLODIPINE BESYLATE 10 MG PO TABS
10.0000 mg | ORAL_TABLET | Freq: Every day | ORAL | Status: DC
  Administered 2011-10-21 – 2011-11-01 (×12): 10 mg via ORAL
  Filled 2011-10-20 (×12): qty 1

## 2011-10-20 MED ORDER — DIPHENHYDRAMINE HCL 12.5 MG/5ML PO ELIX
12.5000 mg | ORAL_SOLUTION | Freq: Four times a day (QID) | ORAL | Status: DC | PRN
  Filled 2011-10-20: qty 5

## 2011-10-20 MED ORDER — PROPOFOL 10 MG/ML IV EMUL
INTRAVENOUS | Status: DC | PRN
  Administered 2011-10-20: 150 mg via INTRAVENOUS

## 2011-10-20 MED ORDER — PHENYLEPHRINE HCL 10 MG/ML IJ SOLN
10.0000 mg | INTRAVENOUS | Status: DC | PRN
  Administered 2011-10-20: 20 ug/min via INTRAVENOUS

## 2011-10-20 MED ORDER — ROCURONIUM BROMIDE 100 MG/10ML IV SOLN
INTRAVENOUS | Status: DC | PRN
  Administered 2011-10-20: 50 mg via INTRAVENOUS

## 2011-10-20 MED ORDER — ONDANSETRON HCL 4 MG/2ML IJ SOLN
4.0000 mg | Freq: Four times a day (QID) | INTRAMUSCULAR | Status: DC | PRN
  Administered 2011-10-21 – 2011-10-24 (×2): 4 mg via INTRAVENOUS
  Filled 2011-10-20: qty 2

## 2011-10-20 MED ORDER — MIDAZOLAM HCL 5 MG/5ML IJ SOLN
INTRAMUSCULAR | Status: DC | PRN
  Administered 2011-10-20 (×2): 1 mg via INTRAVENOUS

## 2011-10-20 MED ORDER — OXYCODONE HCL 5 MG PO TABS: 5.0000 mg | ORAL_TABLET | ORAL | Status: AC | PRN

## 2011-10-20 MED ORDER — 0.9 % SODIUM CHLORIDE (POUR BTL) OPTIME
TOPICAL | Status: DC | PRN
  Administered 2011-10-20: 1000 mL

## 2011-10-20 MED ORDER — FENTANYL 10 MCG/ML IV SOLN
INTRAVENOUS | Status: DC
  Administered 2011-10-20: 10 ug via INTRAVENOUS
  Administered 2011-10-20: 17:00:00 via INTRAVENOUS
  Administered 2011-10-20: 20 ug via INTRAVENOUS
  Administered 2011-10-21: 70 ug via INTRAVENOUS
  Administered 2011-10-21 (×2): 80 ug via INTRAVENOUS
  Administered 2011-10-21 (×2): 50 ug via INTRAVENOUS
  Administered 2011-10-22: 120 ug via INTRAVENOUS
  Administered 2011-10-22: 04:00:00 via INTRAVENOUS
  Administered 2011-10-22: 35.99 ug via INTRAVENOUS
  Administered 2011-10-22: 28.54 ug via INTRAVENOUS
  Administered 2011-10-22: 80 ug via INTRAVENOUS
  Administered 2011-10-22: 70 ug via INTRAVENOUS
  Administered 2011-10-23: 11:00:00 via INTRAVENOUS
  Administered 2011-10-23 (×2): 90 ug via INTRAVENOUS
  Administered 2011-10-23: 30 ug via INTRAVENOUS
  Administered 2011-10-23 (×2): 50 ug via INTRAVENOUS
  Administered 2011-10-23: 10 ug via INTRAVENOUS
  Administered 2011-10-24: 30 ug via INTRAVENOUS
  Administered 2011-10-24 (×2): 50 ug via INTRAVENOUS
  Administered 2011-10-24: 70 ug via INTRAVENOUS
  Administered 2011-10-24: 90 ug via INTRAVENOUS
  Administered 2011-10-25: 10 ug via INTRAVENOUS
  Administered 2011-10-25: 60 ug via INTRAVENOUS
  Administered 2011-10-25: 50 ug via INTRAVENOUS
  Administered 2011-10-25: 60 ug via INTRAVENOUS
  Administered 2011-10-25: 20 ug via INTRAVENOUS
  Administered 2011-10-25: 10 ug via INTRAVENOUS
  Administered 2011-10-25: 80 ug via INTRAVENOUS
  Administered 2011-10-26: 30 ug via INTRAVENOUS
  Administered 2011-10-26: 60 ug via INTRAVENOUS
  Administered 2011-10-26: 20 ug via INTRAVENOUS
  Administered 2011-10-26: 70 ug via INTRAVENOUS
  Administered 2011-10-26: 140 ug via INTRAVENOUS
  Administered 2011-10-27: 80 ug via INTRAVENOUS
  Administered 2011-10-27: 60 ug via INTRAVENOUS
  Administered 2011-10-27: 30 ug via INTRAVENOUS
  Administered 2011-10-27: 80 ug via INTRAVENOUS
  Administered 2011-10-27: 40 ug via INTRAVENOUS
  Administered 2011-10-28: 80 ug via INTRAVENOUS
  Administered 2011-10-28: 60 ug via INTRAVENOUS
  Administered 2011-10-28: 160 ug via INTRAVENOUS
  Administered 2011-10-28: 50 ug via INTRAVENOUS
  Administered 2011-10-28: 10:00:00 via INTRAVENOUS
  Administered 2011-10-29 (×2): 90 ug via INTRAVENOUS
  Administered 2011-10-29: 70 ug via INTRAVENOUS
  Administered 2011-10-29: 108.1 ug via INTRAVENOUS
  Administered 2011-10-29: 09:00:00 via INTRAVENOUS
  Administered 2011-10-29: 110 ug via INTRAVENOUS
  Administered 2011-10-29: 30 ug via INTRAVENOUS
  Administered 2011-10-30: 60 ug via INTRAVENOUS
  Administered 2011-10-30: 90 ug via INTRAVENOUS
  Administered 2011-10-30: 10 ug via INTRAVENOUS
  Administered 2011-10-30: 80 ug via INTRAVENOUS
  Administered 2011-10-30: 220 ug via INTRAVENOUS
  Administered 2011-10-31: 70 ug via INTRAVENOUS
  Administered 2011-10-31: 100 ug via INTRAVENOUS
  Filled 2011-10-20 (×9): qty 50

## 2011-10-20 MED ORDER — METOPROLOL SUCCINATE ER 100 MG PO TB24
100.0000 mg | ORAL_TABLET | Freq: Every day | ORAL | Status: DC
  Administered 2011-10-21 – 2011-11-01 (×12): 100 mg via ORAL
  Filled 2011-10-20 (×12): qty 1

## 2011-10-20 MED ORDER — LACTATED RINGERS IV SOLN
INTRAVENOUS | Status: DC
  Administered 2011-10-20 (×3): via INTRAVENOUS

## 2011-10-20 MED ORDER — ONDANSETRON HCL 4 MG/2ML IJ SOLN: 4.0000 mg | Freq: Four times a day (QID) | INTRAMUSCULAR | Status: DC | PRN

## 2011-10-20 MED ORDER — NALOXONE HCL 0.4 MG/ML IJ SOLN: 0.4000 mg | INTRAMUSCULAR | Status: DC | PRN

## 2011-10-20 MED ORDER — OXYCODONE-ACETAMINOPHEN 5-325 MG PO TABS
1.0000 | ORAL_TABLET | ORAL | Status: DC | PRN
  Filled 2011-10-20: qty 1

## 2011-10-20 MED ORDER — BISACODYL 5 MG PO TBEC
10.0000 mg | DELAYED_RELEASE_TABLET | Freq: Every day | ORAL | Status: DC
  Administered 2011-10-21 – 2011-11-01 (×11): 10 mg via ORAL
  Filled 2011-10-20 (×11): qty 2

## 2011-10-20 MED ORDER — SODIUM CHLORIDE 0.9 % IJ SOLN: 9.0000 mL | INTRAMUSCULAR | Status: DC | PRN

## 2011-10-20 SURGICAL SUPPLY — 73 items
ADH SKN CLS APL DERMABOND .7 (GAUZE/BANDAGES/DRESSINGS) ×1
CANISTER SUCTION 2500CC (MISCELLANEOUS) ×4 IMPLANT
CATH KIT ON Q 5IN SLV (PAIN MANAGEMENT) IMPLANT
CATH THORACIC 28FR (CATHETERS) IMPLANT
CATH THORACIC 36FR (CATHETERS) IMPLANT
CATH THORACIC 36FR RT ANG (CATHETERS) IMPLANT
CLIP TI MEDIUM 6 (CLIP) ×2 IMPLANT
CLOTH BEACON ORANGE TIMEOUT ST (SAFETY) ×2 IMPLANT
CONT SPEC 4OZ CLIKSEAL STRL BL (MISCELLANEOUS) ×4 IMPLANT
DERMABOND ADVANCED (GAUZE/BANDAGES/DRESSINGS) ×1
DERMABOND ADVANCED .7 DNX12 (GAUZE/BANDAGES/DRESSINGS) ×1 IMPLANT
DRAIN CHANNEL 28F RND 3/8 FF (WOUND CARE) IMPLANT
DRAIN CHANNEL 32F RND 10.7 FF (WOUND CARE) IMPLANT
DRAPE LAPAROSCOPIC ABDOMINAL (DRAPES) ×2 IMPLANT
DRAPE SLUSH MACHINE 52X66 (DRAPES) IMPLANT
DRAPE SLUSH/WARMER DISC (DRAPES) ×2 IMPLANT
ELECT REM PT RETURN 9FT ADLT (ELECTROSURGICAL) ×2
ELECTRODE REM PT RTRN 9FT ADLT (ELECTROSURGICAL) ×1 IMPLANT
GLOVE BIOGEL PI IND STRL 6.5 (GLOVE) ×2 IMPLANT
GLOVE BIOGEL PI INDICATOR 6.5 (GLOVE) ×2
GLOVE EUDERMIC 7 POWDERFREE (GLOVE) ×4 IMPLANT
GLOVE SURG SS PI 6.0 STRL IVOR (GLOVE) ×2 IMPLANT
GOWN EXTRA PROTECTION XXL 0583 (GOWNS) ×2 IMPLANT
GOWN PREVENTION PLUS XLARGE (GOWN DISPOSABLE) ×2 IMPLANT
GOWN STRL NON-REIN LRG LVL3 (GOWN DISPOSABLE) ×4 IMPLANT
HANDLE STAPLE ENDO GIA SHORT (STAPLE) ×1
KIT BASIN OR (CUSTOM PROCEDURE TRAY) ×2 IMPLANT
KIT ROOM TURNOVER OR (KITS) ×2 IMPLANT
KIT SUCTION CATH 14FR (SUCTIONS) ×2 IMPLANT
NS IRRIG 1000ML POUR BTL (IV SOLUTION) ×4 IMPLANT
PACK CHEST (CUSTOM PROCEDURE TRAY) ×2 IMPLANT
PAD ARMBOARD 7.5X6 YLW CONV (MISCELLANEOUS) ×4 IMPLANT
POUCH ENDO CATCH II 15MM (MISCELLANEOUS) IMPLANT
POUCH SPECIMEN RETRIEVAL 10MM (ENDOMECHANICALS) ×2 IMPLANT
RELOAD EGIA 45 MED/THCK PURPLE (STAPLE) ×2 IMPLANT
RELOAD EGIA 60 MED/THCK PURPLE (STAPLE) ×10 IMPLANT
SEALANT PROGEL (MISCELLANEOUS) IMPLANT
SEALANT SURG COSEAL 4ML (VASCULAR PRODUCTS) IMPLANT
SEALANT SURG COSEAL 8ML (VASCULAR PRODUCTS) IMPLANT
SOLUTION ANTI FOG 6CC (MISCELLANEOUS) ×4 IMPLANT
SPECIMEN JAR MEDIUM (MISCELLANEOUS) ×2 IMPLANT
SPONGE GAUZE 4X4 12PLY (GAUZE/BANDAGES/DRESSINGS) ×2 IMPLANT
SPONGE INTESTINAL PEANUT (DISPOSABLE) ×2 IMPLANT
STAPLER ENDO GIA 12MM SHORT (STAPLE) ×1 IMPLANT
SUT PROLENE 4 0 RB 1 (SUTURE) ×1
SUT PROLENE 4-0 RB1 .5 CRCL 36 (SUTURE) ×1 IMPLANT
SUT SILK  1 MH (SUTURE) ×2
SUT SILK 1 MH (SUTURE) ×2 IMPLANT
SUT SILK 2 0SH CR/8 30 (SUTURE) ×2 IMPLANT
SUT SILK 3 0SH CR/8 30 (SUTURE) ×2 IMPLANT
SUT VIC AB 0 CTX 27 (SUTURE) IMPLANT
SUT VIC AB 1 CTX 27 (SUTURE) ×2 IMPLANT
SUT VIC AB 2-0 CT1 27 (SUTURE)
SUT VIC AB 2-0 CT1 TAPERPNT 27 (SUTURE) IMPLANT
SUT VIC AB 2-0 CTX 36 (SUTURE) ×2 IMPLANT
SUT VIC AB 2-0 UR6 27 (SUTURE) ×2 IMPLANT
SUT VIC AB 3-0 MH 27 (SUTURE) IMPLANT
SUT VIC AB 3-0 SH 27 (SUTURE)
SUT VIC AB 3-0 SH 27X BRD (SUTURE) IMPLANT
SUT VIC AB 3-0 X1 27 (SUTURE) ×4 IMPLANT
SUT VICRYL 0 UR6 27IN ABS (SUTURE) ×4 IMPLANT
SUT VICRYL 2 TP 1 (SUTURE) ×2 IMPLANT
SWAB COLLECTION DEVICE MRSA (MISCELLANEOUS) IMPLANT
SYSTEM SAHARA CHEST DRAIN ATS (WOUND CARE) ×2 IMPLANT
TAPE CLOTH SURG 4X10 WHT LF (GAUZE/BANDAGES/DRESSINGS) ×2 IMPLANT
TIP APPLICATOR SPRAY EXTEND 16 (VASCULAR PRODUCTS) ×2 IMPLANT
TOWEL OR 17X24 6PK STRL BLUE (TOWEL DISPOSABLE) ×2 IMPLANT
TOWEL OR 17X26 10 PK STRL BLUE (TOWEL DISPOSABLE) ×4 IMPLANT
TRAP SPECIMEN MUCOUS 40CC (MISCELLANEOUS) IMPLANT
TRAY FOLEY CATH 14FR (SET/KITS/TRAYS/PACK) ×2 IMPLANT
TUBE ANAEROBIC SPECIMEN COL (MISCELLANEOUS) IMPLANT
TUNNELER SHEATH ON-Q 11GX8 (MISCELLANEOUS) IMPLANT
WATER STERILE IRR 1000ML POUR (IV SOLUTION) ×2 IMPLANT

## 2011-10-20 NOTE — Brief Op Note (Addendum)
                   [address]            [place] [address]          [phone]    10/20/2011  3:43 PM  PATIENT:  Margaret Mcmillan  56 y.o. female  PRE-OPERATIVE DIAGNOSIS:  LEFT UPPER LOBE MASS  POST-OPERATIVE DIAGNOSIS:  LEFT UPPER LOBE MASS  PROCEDURE:  Procedure(s): VIDEO ASSISTED THORACOSCOPY (VATS)/WEDGE RESECTIONX2 FROZEN- NON MALIGNANT, FIBRO-ELASTIC CHANGES  SURGEON:  Surgeon(s): Loreli Slot, MD  PHYSICIAN ASSISTANT: WAYNE GOLD PA-C  ANESTHESIA:   general  SPECIMEN:  Source of Specimen:  LUL X2  DISPOSITION OF SPECIMEN:  Pathology  DRAINS: 1 #28 Chest Tube(s) in the LEFT HEMITHORAX   PATIENT CONDITION:  PACU - hemodynamically stable.  COMPLICATIONS: NO KNOWN

## 2011-10-20 NOTE — H&P (Signed)
Patient ID: Margaret Mcmillan [facility], female DOB: 1955/12/22, 56 y.o. MRN: [id number]  Margaret Mcmillan returns today to further discuss treatment of her left upper lobe lung nodule. This nodule was first found about a year ago. It was 1.3 centimeters in diameter. Margaret Mcmillan subsequently had a repeat CT year later which showed slight increase in size 1.5 cm. Margaret Mcmillan had a PET scan which showed an SUV of 2. We have discussed following this versus surgical resection. Even though I encouraged following the lesion, Margaret Mcmillan was adamant about having the lesion removed surgically. However her blood pressure was markedly elevated and running 190-200 systolic and 120 to 130 diastolic. And we have been waiting for blood pressure control to improve prior to scheduling surgery.  Margaret Mcmillan returns today with her blood pressure improved and wishes to proceed with surgical resection.  Past Medical History   Diagnosis  Date   .  Hypertension      Since 2006.   Marland Kitchen  Shoulder pain      Started at July, 2012   .  Lipoma of shoulder    .  Tobacco abuse      Since 56 year old   .  Cocaine abuse    .  Headache    .  Arthritis  left arm   .  No pertinent past medical history    .  COPD (chronic obstructive pulmonary disease)  09/02/2011   Current outpatient prescriptions:amLODipine (NORVASC) 10 MG tablet, Take 1 tablet (10 mg total) by mouth daily., Disp: 30 tablet, Rfl: 0; aspirin EC 81 MG EC tablet, Take 1 tablet (81 mg total) by mouth daily., Disp: 30 tablet, Rfl: 6; atorvastatin (LIPITOR) 40 MG tablet, Take 1 tablet (40 mg total) by mouth daily., Disp: 90 tablet, Rfl: 3  hydrochlorothiazide (HYDRODIURIL) 25 MG tablet, Take 1 tablet (25 mg total) by mouth daily., Disp: 30 tablet, Rfl: 6; metoprolol succinate (TOPROL-XL) 100 MG 24 hr tablet, Take 1 tablet (100 mg total) by mouth daily. Take with or immediately following a meal., Disp: 30 tablet, Rfl: 11; quinapril (ACCUPRIL) 10 MG tablet, Take 2 tablets (20 mg total) by mouth 2 (two) times daily., Disp:  180 tablet, Rfl: 3  DISCONTD: metoprolol tartrate (LOPRESSOR) 25 MG tablet, Take 1 tablet (25 mg total) by mouth 2 (two) times daily., Disp: 60 tablet, Rfl: 11  History    Social History   .  Marital Status:  Married     Spouse Name:  N/A     Number of Children:  N/A   .  Years of Education:  N/A    Occupational History   .  Not on file.    Social History Main Topics   .  Smoking status:  Former Smoker -- 1.0 packs/day for 35 years     Types:  Cigarettes     Quit date:  03/27/2011   .  Smokeless tobacco:  Never Used   .  Alcohol Use:  No      rare   .  Drug Use:  No      last used last week   .  Sexually Active:  Yes     Birth Control/ Protection:  Post-menopausal    Other Topics  Concern   .  Not on file    Social History Narrative   .  No narrative on file   35 pack year history of smoking quit 10/12  Review of Systems  Constitutional: Negative.  HENT: Negative.  Respiratory: Negative.  Cardiovascular: Negative.  Gastrointestinal: Negative.  Genitourinary: Negative.  Musculoskeletal: Negative.  Neurological: Negative.  Psychiatric/Behavioral: Negative.  All other systems reviewed and are negative.  Physical Exam  Constitutional: Margaret Mcmillan is oriented to person, place, and time and well-developed, well-nourished, and in no distress.  HENT:  Head: Normocephalic and atraumatic.  Eyes: EOM are normal. Pupils are equal, round, and reactive to light.  Neck: Neck supple.  Cardiovascular: Normal rate, regular rhythm and normal heart sounds.  No murmur heard.  Pulmonary/Chest: Breath sounds normal. Margaret Mcmillan has no wheezes. Margaret Mcmillan has no rales.  Abdominal: Soft. There is no tenderness.  Lymphadenopathy:  Margaret Mcmillan has no cervical adenopathy.  Neurological: Margaret Mcmillan is alert and oriented to person, place, and time.  Skin: Skin is warm and dry.  Impression  56 yo female with 1.5 cm mass LUL. I reviewed our initial discussion re: serial follow up vs. Surgical resection. I once again stated that  i would probably just be followed with CT. Margaret Mcmillan remains insistent that Margaret Mcmillan wants the mass removed. Her BP remains elevated, but is now within range to safely proceed with left VATS if Margaret Mcmillan so desires.  I have discussed with the patient the general nature of the procedure left VATS, wedge resection, possible left upper lobectomy, including the need for general anesthesia and incisions to be used. I have discussed the expected hospital stay, overall recovery and short and long term outcomes. They understand the risks include but are not limited to death, stroke, MI, DVT/PE, bleeding, possible need for transfusion, infections,other organ system dysfunction including respiratory, renal, or GI complications. Margaret Mcmillan understands and accepts these risks and agrees to proceed.  Plan  Left VATS, wedge resection, possible lobectomy Friday 10/20/2011.  No interval change

## 2011-10-20 NOTE — Progress Notes (Signed)
Pt arrived to unit at 1745, was given in report from PACU, 0 air leak to chest tube, upon arrival pt had a +1-2 air leak, reinforced dressing and checked all chest tube lines. Called and spoke to Dr. Dorris Fetch about new air leak upon arrival, No new orders were obtained, will continue to monitor pt.

## 2011-10-20 NOTE — Anesthesia Postprocedure Evaluation (Signed)
Anesthesia Post Note  Patient: Margaret Mcmillan  Procedure(s) Performed: Procedure(s) (LRB): VIDEO ASSISTED THORACOSCOPY (VATS)/WEDGE RESECTION (Left)  Anesthesia type: General  Patient location: PACU  Post pain: Pain level controlled and Adequate analgesia  Post assessment: Post-op Vital signs reviewed, Patient's Cardiovascular Status Stable, Respiratory Function Stable, Patent Airway and Pain level controlled  Last Vitals:  Filed Vitals:   10/20/11 1636  BP:   Pulse: 48  Temp: 36.9 C  Resp: 23    Post vital signs: Reviewed and stable  Level of consciousness: awake, alert  and oriented  Complications: No apparent anesthesia complications

## 2011-10-20 NOTE — Transfer of Care (Signed)
Immediate Anesthesia Transfer of Care Note  Patient: Margaret Mcmillan  Procedure(s) Performed: Procedure(s) (LRB): VIDEO ASSISTED THORACOSCOPY (VATS)/WEDGE RESECTION (Left)  Patient Location: PACU  Anesthesia Type: General  Level of Consciousness: awake and sedated  Airway & Oxygen Therapy: Patient Spontanous Breathing and Patient connected to face mask oxygen  Post-op Assessment: Report given to PACU RN and Post -op Vital signs reviewed and stable  Post vital signs: Reviewed and stable  Complications: No apparent anesthesia complications

## 2011-10-20 NOTE — Interval H&P Note (Signed)
History and Physical Interval Note:  10/20/2011 1:12 PM  Margaret Mcmillan  has presented today for surgery, with the diagnosis of LEFT UPPER LOBE MASS  The various methods of treatment have been discussed with the patient and family. After consideration of risks, benefits and other options for treatment, the patient has consented to  Procedure(s) (LRB): VIDEO ASSISTED THORACOSCOPY (VATS)/WEDGE RESECTION (Left) LOBECTOMY (Left) as a surgical intervention .  The patients' history has been reviewed, patient examined, no change in status, stable for surgery.  I have reviewed the patients' chart and labs.  Questions were answered to the patient's satisfaction.     Nassir Neidert C

## 2011-10-20 NOTE — Anesthesia Preprocedure Evaluation (Signed)
Anesthesia Evaluation  Patient identified by MRN, date of birth, ID band Patient awake    Reviewed: Allergy & Precautions, H&P , NPO status , Patient's Chart, lab work & pertinent test results  Airway Mallampati: II  Neck ROM: full    Dental   Pulmonary COPDformer smoker         Cardiovascular hypertension, + dysrhythmias     Neuro/Psych  Headaches,    GI/Hepatic   Endo/Other    Renal/GU      Musculoskeletal   Abdominal   Peds  Hematology   Anesthesia Other Findings   Reproductive/Obstetrics                           Anesthesia Physical Anesthesia Plan  ASA: III  Anesthesia Plan: General   Post-op Pain Management:    Induction: Intravenous  Airway Management Planned: Oral ETT and Double Lumen EBT  Additional Equipment:   Intra-op Plan:   Post-operative Plan: Extubation in OR  Informed Consent: I have reviewed the patients History and Physical, chart, labs and discussed the procedure including the risks, benefits and alternatives for the proposed anesthesia with the patient or authorized representative who has indicated his/her understanding and acceptance.     Plan Discussed with: CRNA and Surgeon  Anesthesia Plan Comments:         Anesthesia Quick Evaluation

## 2011-10-20 NOTE — OR Nursing (Signed)
4mL of progel (pleural Air Leak Sealant) used  Expiration date: 05/26/2013

## 2011-10-21 ENCOUNTER — Inpatient Hospital Stay (HOSPITAL_COMMUNITY): Payer: Self-pay

## 2011-10-21 ENCOUNTER — Encounter (HOSPITAL_COMMUNITY): Payer: Self-pay | Admitting: *Deleted

## 2011-10-21 LAB — CBC
Hemoglobin: 12.7 g/dL (ref 12.0–15.0)
Platelets: 155 10*3/uL (ref 150–400)
RBC: 4.39 MIL/uL (ref 3.87–5.11)
WBC: 8.6 10*3/uL (ref 4.0–10.5)

## 2011-10-21 LAB — BLOOD GAS, ARTERIAL
Acid-Base Excess: 4.7 mmol/L — ABNORMAL HIGH (ref 0.0–2.0)
FIO2: 0.28 %
pCO2 arterial: 44.4 mmHg (ref 35.0–45.0)
pO2, Arterial: 114 mmHg — ABNORMAL HIGH (ref 80.0–100.0)

## 2011-10-21 LAB — BASIC METABOLIC PANEL
BUN: 10 mg/dL (ref 6–23)
Calcium: 8.4 mg/dL (ref 8.4–10.5)
GFR calc Af Amer: 88 mL/min — ABNORMAL LOW (ref >90)
GFR calc non Af Amer: 76 mL/min — ABNORMAL LOW (ref >90)
Glucose, Bld: 150 mg/dL — ABNORMAL HIGH (ref 70–99)
Potassium: 3.1 mEq/L — ABNORMAL LOW (ref 3.5–5.1)
Sodium: 137 mEq/L (ref 135–145)

## 2011-10-21 NOTE — Op Note (Signed)
NAMENOTNAMED, CROUCHER NO.:  [id number]  MEDICAL RECORD NO.:  [id number]  LOCATION:  3313                         FACILITY:  [facility]  PHYSICIAN:  Salvatore Decent. Dorris Fetch, M.D.DATE OF BIRTH:  1955/09/22  DATE OF PROCEDURE:  10/20/2011 DATE OF DISCHARGE:                              OPERATIVE REPORT   PREOPERATIVE DIAGNOSIS:  Left upper lobe nodule.  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  No neoplasms seen.  PROCEDURES: 1. Left video-assisted thoracoscopy. 2. Wedge resection of left upper lobe x2.  SURGEON:  Salvatore Decent. Dorris Fetch, MD  ASSISTANT:  Rowe Clack, PA-C  ANESTHESIA:  General.  FINDINGS:  Significant emphysematous disease in the left upper lobe. Two separate and distinct nodules felt relatively close in proximity, resected with separate wedge resections.  Frozen section of each revealed fibroelastic changes and possible infection, but no definite granulomas seen, no evidence of cancer.  CLINICAL NOTE:  Ms. Leard is a 56 year old woman with a history of tobacco abuse, who was found about a year ago to have a left upper lobe nodule.  On a repeat CT, this had grown only very slightly by about 2 mm over the course of the year.  A PET CT was done, which showed mild hypermetabolic uptake.  The patient was advised that it would be reasonable to continue to follow this with serial radiologic followup; however, she was adamant about having the nodule removed and no longer wanted to have repeated CT scans.  The indications, risks, benefits, and alternatives were discussed in detail with her.  She understood and accepted the risks of surgery and agreed to proceed.  OPERATIVE NOTE:  Ms. Odonnel was brought to the preop holding area on October 20, 2011.  There, the Anesthesia Service placed an arterial blood pressure monitoring line and a central line.  She was taken to  the operating room, anesthetized, and intubated.  Intravenous antibiotics were administered.  PAS hose were placed for DVT prophylaxis.  A Foley catheter was placed.  She was placed in a right lateral decubitus position, and the left chest was prepped and draped in usual sterile fashion.  Single lung ventilation of the right lung was carried out and was tolerated well throughout the procedure.  An incision was made in the midaxillary line approximately the seventh intercostal space.  It was carried through the skin and subcutaneous tissue.  The chest was entered bluntly using a hemostat.  A port was inserted, and the thoracoscope was placed in the chest.  There was no cross ventilation of the left lung, but there was air trapping.  Suction was applied through the endotracheal tube and that improved visualization dramatically.  There were some adhesions of the upper lobe to the parietal pleura.  A small port incision was made anteriorly and a second port incision was made below the tip of the scapula.  The adhesions were taken down with electrocautery.  The left upper lobe was palpated.  There was a small nodule on the lateral aspect of the left upper lobe.  This was resected as a wedge resection.  There were extensive bullous changes in the  lung in the vicinity of this nodule.  The specimen was placed in an endoscopic retrieval bag and removed from the chest.  There was a palpable nodule within the lung.  This was marked and sent to pathology for frozen section.  Further inspection of the upper lobe identified a second palpable nodule not far from where the first nodule had been.  A separate wedge resection of this was done again using sequential firings of an Endo GIA-type stapler.  This specimen was smaller and was removed through the incision without necessitating the use of an endoscopic retrieval bag.  This specimen was sent for a frozen section as well. Extensive palpation of the  remainder of the upper lobe revealed no palpable lesions, although the entire upper lobe was somewhat nodular in texture.  Frozen section of the nodule subsequently returned with fibroelastic changes.  No evidence of cancer was seen.  Specimens will be sent for cultures for fungus and AFB.  The lung was partially reinflated.  ProGEL was applied to the suture lines.  A 28-French straight chest tube was placed through the original port incision and directed to the apex.  It was secured with a #1 silk suture.  Full inflation of the left lung then was performed and the patient was placed back on 2 lung ventilation.  The scope was withdrawn. The posterior port incision was closed with #1 Vicryl fascial suture and a 3-0 Vicryl subcuticular suture.  The anterior incision of the muscle layers were reapproximated with a #1 Vicryl, followed by a 2-0 Vicryl subcutaneous suture, and a 3-0 Vicryl subcuticular suture.  All sponge, needle, and instrument counts were correct at the end of the procedure. The patient was extubated in the operating room and taken to the postanesthetic care unit in good condition.  All sponge, needle, and instrument counts were correct at the end of the procedure.     Salvatore Decent Dorris Fetch, M.D.     SCH/MEDQ  D:  10/20/2011  T:  10/21/2011  Job:  [id number]

## 2011-10-21 NOTE — Progress Notes (Signed)
1 Day Post-Op Procedure(s) (LRB): VIDEO ASSISTED THORACOSCOPY (VATS)/WEDGE RESECTION (Left) Subjective: Some nausea after walking Pain well controlled  Objective: Vital signs in last 24 hours: Temp:  [97.2 F (36.2 Mcmillan)-98.9 F (37.2 Mcmillan)] 98.9 F (37.2 Mcmillan) (04/27 0414) Pulse Rate:  [47-97] 53  (04/27 0414) Cardiac Rhythm:  [-] Sinus bradycardia (04/27 0414) Resp:  [15-23] 20  (04/27 0436) BP: (121-143)/(76-95) 136/87 mmHg (04/27 0414) SpO2:  [94 %-100 %] 100 % (04/27 0436) Arterial Line BP: (120-164)/(69-102) 136/69 mmHg (04/27 0414) Weight:  [123 lb 10.9 oz (56.1 kg)] 123 lb 10.9 oz (56.1 kg) (04/26 1800)  Hemodynamic parameters for last 24 hours:    Intake/Output from previous day: 04/26 0701 - 04/27 0700 In: 3200 [P.O.:600; I.V.:2600] Out: 935 [Urine:875; Chest Tube:60] Intake/Output this shift:    General appearance: alert and no distress Neurologic: intact Heart: regular rate and rhythm Lungs: diminished breath sounds base - left Abdomen: normal findings: soft, non-tender + Small air leak  Lab Results:  Basename 10/21/11 0431 10/18/11 1109  WBC 8.6 8.5  HGB 12.7 15.0  HCT 38.5 43.4  PLT 155 179   BMET:  Basename 10/21/11 0431 10/18/11 1109  NA 137 140  K 3.1* 3.2*  CL 101 102  CO2 28 25  GLUCOSE 150* 108*  BUN 10 18  CREATININE 0.85 1.18*  CALCIUM 8.4 9.7    PT/INR:  Basename 10/18/11 1109  LABPROT 13.5  INR 1.01   ABG    Component Value Date/Time   PHART 7.429* 10/21/2011 0345   HCO3 28.9* 10/21/2011 0345   TCO2 30.3 10/21/2011 0345   O2SAT 98.8 10/21/2011 0345   CBG (last 3)  No results found for this basename: GLUCAP:3 in the last 72 hours  Assessment/Plan: S/P Procedure(s) (LRB): VIDEO ASSISTED THORACOSCOPY (VATS)/WEDGE RESECTION (Left) POD # 1 wedge x 2 LUL CV- some asymptomatic bradycardia overnight, SR this AM RESP- small air leak, keep CT on suction for now  Some atelectasis on left Hypokalemia- supplement DVT prophylaxis- PAS,  lovenox, ambulation   LOS: 1 day    Margaret Mcmillan 10/21/2011

## 2011-10-22 ENCOUNTER — Inpatient Hospital Stay (HOSPITAL_COMMUNITY): Payer: Self-pay

## 2011-10-22 LAB — COMPREHENSIVE METABOLIC PANEL
AST: 27 U/L (ref 0–37)
Albumin: 3 g/dL — ABNORMAL LOW (ref 3.5–5.2)
CO2: 31 mEq/L (ref 19–32)
Calcium: 9 mg/dL (ref 8.4–10.5)
Creatinine, Ser: 0.89 mg/dL (ref 0.50–1.10)
GFR calc non Af Amer: 72 mL/min — ABNORMAL LOW (ref >90)
Total Protein: 6.3 g/dL (ref 6.0–8.3)

## 2011-10-22 LAB — CBC
MCH: 30.1 pg (ref 26.0–34.0)
MCHC: 33.6 g/dL (ref 30.0–36.0)
MCV: 89.7 fL (ref 78.0–100.0)
Platelets: 155 10*3/uL (ref 150–400)
RDW: 14.2 % (ref 11.5–15.5)

## 2011-10-22 MED ORDER — POTASSIUM CHLORIDE CRYS ER 20 MEQ PO TBCR
40.0000 meq | EXTENDED_RELEASE_TABLET | Freq: Two times a day (BID) | ORAL | Status: AC
  Administered 2011-10-22: 20 meq via ORAL
  Administered 2011-10-22: 40 meq via ORAL
  Filled 2011-10-22 (×2): qty 2

## 2011-10-22 MED ORDER — POTASSIUM CHLORIDE CRYS ER 20 MEQ PO TBCR
EXTENDED_RELEASE_TABLET | ORAL | Status: AC
  Administered 2011-10-22: 20 meq via ORAL
  Filled 2011-10-22: qty 1

## 2011-10-22 MED ORDER — ENOXAPARIN SODIUM 40 MG/0.4ML ~~LOC~~ SOLN
40.0000 mg | SUBCUTANEOUS | Status: DC
  Administered 2011-10-22 – 2011-10-31 (×10): 40 mg via SUBCUTANEOUS
  Filled 2011-10-22 (×11): qty 0.4

## 2011-10-22 NOTE — Progress Notes (Signed)
2 Days Post-Op Procedure(s) (LRB): VIDEO ASSISTED THORACOSCOPY (VATS)/WEDGE RESECTION (Left) Subjective: Some incisional pain, PCA effective   Objective: Vital signs in last 24 hours: Temp:  [98.3 F (36.8 C)-99.1 F (37.3 C)] 98.3 F (36.8 C) (04/28 0800) Pulse Rate:  [60-71] 71  (04/28 0800) Cardiac Rhythm:  [-] Normal sinus rhythm (04/28 0800) Resp:  [16-23] 22  (04/28 0800) BP: (122-146)/(76-95) 146/95 mmHg (04/28 0800) SpO2:  [92 %-97 %] 93 % (04/28 0800)  Hemodynamic parameters for last 24 hours:    Intake/Output from previous day: 04/27 0701 - 04/28 0700 In: 1340 [P.O.:600; I.V.:740] Out: 1705 [Urine:1475; Chest Tube:230] Intake/Output this shift: Total I/O In: 190 [P.O.:120; I.V.:20; IV Piggyback:50] Out: 0   General appearance: alert and no distress Heart: regular rate and rhythm Lungs: clear to auscultation bilaterally Abdomen: normal findings: soft, non-tender  Lab Results:  Basename 10/22/11 0320 10/21/11 0431  WBC 10.1 8.6  HGB 13.4 12.7  HCT 39.9 38.5  PLT 155 155   BMET:  Basename 10/22/11 0320 10/21/11 0431  NA 139 137  K 3.2* 3.1*  CL 100 101  CO2 31 28  GLUCOSE 102* 150*  BUN 6 10  CREATININE 0.89 0.85  CALCIUM 9.0 8.4    PT/INR: No results found for this basename: LABPROT,INR in the last 72 hours ABG    Component Value Date/Time   PHART 7.429* 10/21/2011 0345   HCO3 28.9* 10/21/2011 0345   TCO2 30.3 10/21/2011 0345   O2SAT 98.8 10/21/2011 0345   CBG (last 3)  No results found for this basename: GLUCAP:3 in the last 72 hours  Assessment/Plan: S/P Procedure(s) (LRB): VIDEO ASSISTED THORACOSCOPY (VATS)/WEDGE RESECTION (Left) POD # 2 wedge resection Small air leak- will place CT to water seal Ambulate Continue PCA Hypokalemia- supplement   LOS: 2 days    Zella Dewan C 10/22/2011

## 2011-10-23 ENCOUNTER — Encounter (HOSPITAL_COMMUNITY): Payer: Self-pay | Admitting: Radiology

## 2011-10-23 ENCOUNTER — Inpatient Hospital Stay (HOSPITAL_COMMUNITY): Payer: Self-pay

## 2011-10-23 LAB — BASIC METABOLIC PANEL
BUN: 7 mg/dL (ref 6–23)
CO2: 28 mEq/L (ref 19–32)
Chloride: 103 mEq/L (ref 96–112)
GFR calc Af Amer: >90 mL/min (ref >90)
Potassium: 4 mEq/L (ref 3.5–5.1)

## 2011-10-23 LAB — BLOOD GAS, ARTERIAL
Drawn by: 249101
TCO2: 28.3 mmol/L (ref 0–100)
pCO2 arterial: 41 mmHg (ref 35.0–45.0)
pH, Arterial: 7.435 — ABNORMAL HIGH (ref 7.350–7.400)

## 2011-10-23 LAB — GLUCOSE, CAPILLARY: Glucose-Capillary: 115 mg/dL — ABNORMAL HIGH (ref 70–99)

## 2011-10-23 MED ORDER — WHITE PETROLATUM GEL
Status: AC
  Filled 2011-10-23: qty 5

## 2011-10-23 MED ORDER — IOHEXOL 350 MG/ML SOLN: 100.0000 mL | Freq: Once | INTRAVENOUS | Status: AC | PRN

## 2011-10-23 NOTE — Progress Notes (Signed)
Pt remains on venti, appears more relaxed with breathing, but sats are still around 94%. Will continue to monitor.

## 2011-10-23 NOTE — Progress Notes (Addendum)
Late entry: Around 1515, Pt began coughing; changed sat probe to obtain good wave form. Pt desating, applied O2 via [place]. Pt did not recover sats, so a venti, then a non-rebreather was placed on Pt. Pt had been amb/BRP earlier today, and looked different. A quick neuro assessment was performed, pupils were equal but Pt could not grip with left hand. RR & Dr. Dorris Fetch were called; both quickly arrived on the scene. Dr. Jory Sims a code stroke, and representative met [place] @ CT. See RR note for further details.

## 2011-10-23 NOTE — Consult Note (Addendum)
Referring Physician: Dorris Fetch    Chief Complaint: Left sided weakness  HPI: Margaret Mcmillan is an 56 y.o. female who is s/p VATS procedure on 4/26.  Patient was progressing well-up, around and ambulating.  Today became tachypneic and hypoxemic.  While being evaluated had acute onset left sided weakness and mental status change.  Code stroke was called at that time.  LSN: 1500 tPA Given: No: Recent surgery, rapid improvement  Past Medical History  Diagnosis Date  . Shoulder pain     Started at July, 2012  . Lipoma of shoulder   . Tobacco abuse     Since 56 year old  . Cocaine abuse   . Headache   . Arthritis left arm  . No pertinent past medical history   . COPD (chronic obstructive pulmonary disease) 09/02/2011  . Hypertension     takes Amlodipine,HCTZ,and Metoprolol daily  . Hyperlipidemia     takes Lipitor daily  . Dysrhythmia     takes Metoprolol daily  . Bronchitis     hx of  . Lung mass     left upper lobe  . Constipation     related to medications and takes OTC stoof softener PRN  . Blood transfusion 2001 or 2002    Past Surgical History  Procedure Date  . Uterine fibroid surgery      at 2002 to 2003  . Fracture surgery at age 76    left arm    Family History  Problem Relation Age of Onset  . Hypertension      in mother, 4 brothers, 1 sister and  1 son  . Diabetes type II      mother and 1 sister and 1 brother  . Hypertension Father   . Gout Mother   . Hirschsprung's disease Son   . Anesthesia problems Neg Hx   . Hypotension Neg Hx   . Malignant hyperthermia Neg Hx   . Pseudochol deficiency Neg Hx    Social History:  reports that she quit smoking about 6 months ago. Her smoking use included Cigarettes. She has a 35 pack-year smoking history. She has never used smokeless tobacco. She reports that she uses illicit drugs (Marijuana). She reports that she does not drink alcohol.  Allergies: No Known Allergies  Medications:  I have reviewed the  patient's current medications. Scheduled:   . amLODipine  10 mg Oral Daily  . aspirin EC  81 mg Oral Daily  . atorvastatin  40 mg Oral Q supper  . bisacodyl  10 mg Oral Daily  . enoxaparin (LOVENOX) injection  40 mg Subcutaneous Q24H  . fentaNYL   Intravenous Q4H  . metoprolol succinate  100 mg Oral Daily  . potassium chloride SA      . potassium chloride  40 mEq Oral BID  . white petrolatum        ROS: History obtained from the patient  General ROS: negative for - chills, fatigue, fever, night sweats, weight gain or weight loss Psychological ROS: negative for - behavioral disorder, hallucinations, memory difficulties, mood swings or suicidal ideation Ophthalmic ROS: negative for - blurry vision, double vision, eye pain or loss of vision ENT ROS: negative for - epistaxis, nasal discharge, oral lesions, sore throat, tinnitus or vertigo Allergy and Immunology ROS: negative for - hives or itchy/watery eyes Hematological and Lymphatic ROS: negative for - bleeding problems, bruising or swollen lymph nodes Endocrine ROS: negative for - galactorrhea, hair pattern changes, polydipsia/polyuria or temperature intolerance Respiratory  ROS: hypoxemic Cardiovascular ROS: recent surgery Gastrointestinal ROS: negative for - abdominal pain, diarrhea, hematemesis, nausea/vomiting or stool incontinence Genito-Urinary ROS: negative for - dysuria, hematuria, incontinence or urinary frequency/urgency Musculoskeletal ROS: negative for - joint swelling or muscular weakness Neurological ROS: as noted in HPI Dermatological ROS: negative for rash and skin lesion changes  Physical Examination: Blood pressure 133/108, pulse 79, temperature 97.4 F (36.3 C), temperature source Oral, resp. rate 24, height 5\' 5"  (1.651 m), weight 56.1 kg (123 lb 10.9 oz), SpO2 90.00%.  Neurologic Examination: Mental Status: Alert, oriented, thought content appropriate.  Speech fluent without evidence of aphasia.  Able to  follow 3 step commands without difficulty. Cranial Nerves: II: visual fields grossly normal, pupils equal, round, reactive to light and accommodation III,IV, VI: ptosis not present, extra-ocular motions intact bilaterally V,VII: right facial droop, facial light touch sensation normal bilaterally VIII: hearing normal bilaterally IX,X: gag reflex present XI: trapezius strength/neck flexion strength normal bilaterally XII: tongue strength normal  Motor: Right : Upper extremity   5/5    Left:     Upper extremity   5/5  Lower extremity   5/5     Lower extremity   5/5 Tone and bulk:normal tone throughout; no atrophy noted Sensory: Pinprick and light touch intact throughout, bilaterally Deep Tendon Reflexes: 3+ and symmetric throughout Plantars: Right: downgoing   Left: downgoing Cerebellar: normal finger-to-nose and normal heel-to-shin test   Results for orders placed during the hospital encounter of 10/20/11 (from the past 48 hour(s))  CBC     Status: Normal   Collection Time   10/22/11  3:20 AM      Component Value Range Comment   WBC 10.1  4.0 - 10.5 (K/uL)    RBC 4.45  3.87 - 5.11 (MIL/uL)    Hemoglobin 13.4  12.0 - 15.0 (g/dL)    HCT 96.2  95.2 - 84.1 (%)    MCV 89.7  78.0 - 100.0 (fL)    MCH 30.1  26.0 - 34.0 (pg)    MCHC 33.6  30.0 - 36.0 (g/dL)    RDW 32.4  40.1 - 02.7 (%)    Platelets 155  150 - 400 (K/uL)   COMPREHENSIVE METABOLIC PANEL     Status: Abnormal   Collection Time   10/22/11  3:20 AM      Component Value Range Comment   Sodium 139  135 - 145 (mEq/L)    Potassium 3.2 (*) 3.5 - 5.1 (mEq/L)    Chloride 100  96 - 112 (mEq/L)    CO2 31  19 - 32 (mEq/L)    Glucose, Bld 102 (*) 70 - 99 (mg/dL)    BUN 6  6 - 23 (mg/dL)    Creatinine, Ser 2.53  0.50 - 1.10 (mg/dL)    Calcium 9.0  8.4 - 10.5 (mg/dL)    Total Protein 6.3  6.0 - 8.3 (g/dL)    Albumin 3.0 (*) 3.5 - 5.2 (g/dL)    AST 27  0 - 37 (U/L)    ALT 14  0 - 35 (U/L)    Alkaline Phosphatase 74  39 - 117 (U/L)     Total Bilirubin 0.4  0.3 - 1.2 (mg/dL)    GFR calc non Af Amer 72 (*) >90 (mL/min)    GFR calc Af Amer 83 (*) >90 (mL/min)   BASIC METABOLIC PANEL     Status: Abnormal   Collection Time   10/23/11  4:00 AM      Component  Value Range Comment   Sodium 139  135 - 145 (mEq/L)    Potassium 4.0  3.5 - 5.1 (mEq/L) NO VISIBLE HEMOLYSIS   Chloride 103  96 - 112 (mEq/L)    CO2 28  19 - 32 (mEq/L)    Glucose, Bld 102 (*) 70 - 99 (mg/dL)    BUN 7  6 - 23 (mg/dL)    Creatinine, Ser 1.61  0.50 - 1.10 (mg/dL)    Calcium 8.8  8.4 - 10.5 (mg/dL)    GFR calc non Af Amer 78 (*) >90 (mL/min)    GFR calc Af Amer >90  >90 (mL/min)    Ct Head Wo Contrast  10/23/2011  *RADIOLOGY REPORT*  Clinical Data: New left hemiparesis, rule out stroke  CT HEAD WITHOUT CONTRAST  Technique:  Contiguous axial images were obtained from the base of the skull through the vertex without contrast.  Comparison: 06/03/2011  Findings: No skull fracture is noted.  Paranasal sinuses and mastoid air cells are unremarkable.  No intracranial hemorrhage, mass effect or midline shift.  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 Patchy small vessel disease within the deep white matter is stable from prior exam.  No definite acute cortical infarction.  No mass lesion is noted on this unenhanced scan. There is focal area of decreased attenuation in the right cerebellum medially measures 1.4 cm.  Subacute ischemia cannot be excluded.  Clinical correlation is necessary.  IMPRESSION: No definite acute cortical infarct.  Stable white matter chronic small vessel ischemic changes. 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 Subacute ischemia cannot be excluded.  Clinical correlation is necessary.  Original Report Authenticated By: Natasha Mead, M.D.   Dg Chest Port 1 View  10/23/2011  *RADIOLOGY REPORT*  Clinical Data: Status post wedge resection.   PORTABLE CHEST - 1 VIEW  Comparison: Portable chest 10/22/2011.  Findings: A right IJ line is stable.  Left-sided chest tube is in place.  A small left apical pneumothorax is slightly larger than on the prior studies.  The heart is mildly enlarged.  Left upper lobe airspace disease remains concerning for pneumonia. There is some atelectasis at the left base.  The right lung is clear.  IMPRESSION:  1.  Slight increase and a left apical pneumothorax.  The chest tube remains in place. 2.  Persistent left upper lobe pneumonia. 3.  Atelectasis at the left base.  Original Report Authenticated By: Jamesetta Orleans. MATTERN, M.D.   Dg Chest Port 1 View  10/22/2011  *RADIOLOGY REPORT*  Clinical Data: Status post wedge resection of the left lung.  PORTABLE CHEST - 1 VIEW  Comparison: 10/21/2011  Findings: There is a right IJ catheter with tip in the SVC.  There is a left chest tube in place.  Tiny left apical pneumothorax is unchanged from previous exam.  Postoperative changes involving the left midlung noted.  This is unchanged from previous exam.  Right lung remains clear.  IMPRESSION:  1.  Stable exam. 2.  No change in tiny left apical pneumothorax with chest tube in place.  Original Report Authenticated By: Rosealee Albee, M.D.    Assessment: 56 y.o. female with acute onset left hemiparesis.  Symptoms improved quickly.  CT unremarkable for an acute event.  New (from December 2012) area of hypodensity in the cerebellum.  Embolic event can not be ruled out.  Chest CT shows no evidence of PE.  With patient's return to baseline would not consider any intervention at this time.  Would continue to follow clinically.     Stroke Risk Factors - hyperlipidemia, hypertension, smoking and polysubstance abuse  Plan: 1. Frequent neuro checks 2. Continue ASA    Thana Farr, MD [facility] [phone] 10/23/2011, 4:17 PM

## 2011-10-23 NOTE — Progress Notes (Signed)
CTSP for sudden tachypnea and change in mental status  She had been doing well during the day with no complaints. She had ambulated without difficulty.  A few minutes ago she developed acute tachypnea with decreased O2 sats and was noted to have altered mental status.  She was noted to not be using her left hand by RN  On exam SR 87, 145/63, O2 sats 100% on NRB  Lungs relatively clear  Neuro awake, anxious, left hemiparesis  Code stroke called.  Will check ABG  Will get stat head CT and do a CT angio of chest to r/o PE while she's there.

## 2011-10-23 NOTE — Progress Notes (Signed)
Patient ID: Margaret Mcmillan [facility], female   DOB: 06-Feb-1956, 56 y.o.   MRN: [id number] Back from CT   Results still pending  She's alert and breathing easily  Good strength on left side, face symmetric  She had just had central line maintenance/ flushing prior to event  ? Possible air embolism, she's recovering rapidly

## 2011-10-23 NOTE — Progress Notes (Signed)
3 Days Post-Op Procedure(s) (LRB): VIDEO ASSISTED THORACOSCOPY (VATS)/WEDGE RESECTION (Left) Subjective: No complaints  Objective: Vital signs in last 24 hours: Temp:  [98 F (36.7 C)-99.2 F (37.3 C)] 98 F (36.7 C) (04/28 2342) Pulse Rate:  [64-77] 71  (04/29 0340) Cardiac Rhythm:  [-] Normal sinus rhythm (04/29 0340) Resp:  [19-23] 23  (04/29 0340) BP: (121-146)/(71-95) 130/86 mmHg (04/29 0340) SpO2:  [90 %-96 %] 90 % (04/29 0340)  Hemodynamic parameters for last 24 hours:    Intake/Output from previous day: 04/28 0701 - 04/29 0700 In: 630 [P.O.:120; I.V.:460; IV Piggyback:50] Out: 0  Intake/Output this shift:    General appearance: alert and no distress  Lab Results:  Basename 10/22/11 0320 10/21/11 0431  WBC 10.1 8.6  HGB 13.4 12.7  HCT 39.9 38.5  PLT 155 155   BMET:  Basename 10/23/11 0400 10/22/11 0320  NA 139 139  K 4.0 3.2*  CL 103 100  CO2 28 31  GLUCOSE 102* 102*  BUN 7 6  CREATININE 0.83 0.89  CALCIUM 8.8 9.0    PT/INR: No results found for this basename: LABPROT,INR in the last 72 hours ABG    Component Value Date/Time   PHART 7.429* 10/21/2011 0345   HCO3 28.9* 10/21/2011 0345   TCO2 30.3 10/21/2011 0345   O2SAT 98.8 10/21/2011 0345   CBG (last 3)  No results found for this basename: GLUCAP:3 in the last 72 hours  Assessment/Plan: S/P Procedure(s) (LRB): VIDEO ASSISTED THORACOSCOPY (VATS)/WEDGE RESECTION (Left) - POD # 3 L VATS wedge resection Still has small air leak- keep tube to water seal   LOS: 3 days    Rees Matura C 10/23/2011

## 2011-10-23 NOTE — Code Documentation (Addendum)
Called to assess patient with O2 desaturations.  On arrival to patients room Dr. Dorris Fetch present along with RN staff frpm 3300.  Patient supine in bed with HOB at 90 degrees.  Diaphoretic and tachypnic - RR 48 bil BS equal and clear.  Patient focuses but seems slightly dazed.  Left side flacid.  After few moments begins to speak few words.  Following some commands.  HR 108.  BP 146/90.  cbg 115.  Code Stroke called by Dr. Dorris Fetch at [phone]. LSW 1500.  Stroke team at bedside at time of call.  To CT scan at 1530.  #20 angiocath inserted right antecubital times one stick for IV contrast for CT-A chest.  Patient more alert - RR 24 - moving left side but does have drift.  Speaking clear - answering questions appropriately.  Dr. Thad Ranger updated by Benson Norway, RN.  Tol CT scans well.  Back to 3300.  NIHSS done - all symptoms now resolved with NIHSS of 0.  Dr. Dorris Fetch present.  Dr. Thad Ranger present.  Placed on 50% VM for O2 sats in 85-88% range with 5 liter nasal cannula.  ABG results on 5 liter n/c reported to Dr. Dorris Fetch.  BP 149/97 SR 84 RR 20-24  O2 sats 97% on 50% VM.  Code stroke cancelled per order Dr. Thad Ranger.  Patient remains at baseline.  Handoff report to Seward Grater, Charity fundraiser.  Family updated by MD's.  Call for changes.

## 2011-10-23 NOTE — Plan of Care (Signed)
Problem: Phase II Progression Outcomes Goal: Other Phase II Outcomes/Goals Outcome: Not Met (add Reason) Last CT remains; Pt still room air, amb, BRP

## 2011-10-24 ENCOUNTER — Inpatient Hospital Stay (HOSPITAL_COMMUNITY): Payer: Self-pay

## 2011-10-24 NOTE — Progress Notes (Signed)
Paged PA re: EtCO2 monitoring order. Will await orders.

## 2011-10-24 NOTE — Progress Notes (Signed)
[facility] PROGRESS NOTE    SUBJECTIVE   No further episodes.  Patient is alert and oriented. Feels back to her baseline.   OBJECTIVE   Vital signs in last 24 hours: Temp:  [97.6 F (36.4 C)-98.5 F (36.9 C)] 98.5 F (36.9 C) (04/30 0330) Pulse Rate:  [72-91] 74  (04/30 0709) Resp:  [17-25] 18  (04/30 0709) BP: (133-154)/(86-111) 138/93 mmHg (04/30 0709) SpO2:  [91 %-99 %] 98 % (04/30 0709) FiO2 (%):  [24 %-50 %] 24 % (04/30 0630)  Intake/Output from previous day: 04/29 0701 - 04/30 0700 In: 600 [P.O.:360; I.V.:240] Out: 520 [Urine:450; Chest Tube:70] Intake/Output this shift:   Nutritional status: Cardiac  Past Medical History  Diagnosis Date  . Shoulder pain     Started at July, 2012  . Lipoma of shoulder   . Tobacco abuse     Since 56 year old  . Cocaine abuse   . Headache   . Arthritis left arm  . No pertinent past medical history   . COPD (chronic obstructive pulmonary disease) 09/02/2011  . Hypertension     takes Amlodipine,HCTZ,and Metoprolol daily  . Hyperlipidemia     takes Lipitor daily  . Dysrhythmia     takes Metoprolol daily  . Bronchitis     hx of  . Lung mass     left upper lobe  . Constipation     related to medications and takes OTC stoof softener PRN  . Blood transfusion 2001 or 2002    Neurologic Exam:  Mental Status: Alert, oriented, thought content appropriate.  Speech fluent without evidence of aphasia. Able to follow 3 step commands without difficulty. Cranial Nerves: II-Visual fields grossly intact. III/IV/VI-Extraocular movements intact.  Pupils reactive bilaterally. V/VII-Smile symmetric VIII-grossly intact IX/X-normal gag XI-bilateral shoulder shrug XII-midline tongue extension Motor: 5/5 bilaterally with normal tone and bulk Sensory: Pinprick and light touch intact throughout, bilaterally Deep Tendon Reflexes: 2+ upper extremities and 3+ lower extremities symmetric  throughout Plantars: Up-going bilaterally Cerebellar: Normal finger-to-nose, normal rapid alternating movements and normal heel-to-shin test.    Lab Results: Results for orders placed during the hospital encounter of 10/20/11 (from the past 24 hour(s))  GLUCOSE, CAPILLARY     Status: Abnormal   Collection Time   10/23/11  3:13 PM      Component Value Range   Glucose-Capillary 115 (*) 70 - 99 (mg/dL)  BLOOD GAS, ARTERIAL     Status: Abnormal   Collection Time   10/23/11  4:00 PM      Component Value Range   O2 Content 4.0     Delivery systems NASAL CANNULA     pH, Arterial 7.435 (*) 7.350 - 7.400    pCO2 arterial 41.0  35.0 - 45.0 (mmHg)   pO2, Arterial 48.9 (*) 80.0 - 100.0 (mmHg)   Bicarbonate 27.1 (*) 20.0 - 24.0 (mEq/L)   TCO2 28.3  0 - 100 (mmol/L)   Acid-Base Excess 3.1 (*) 0.0 - 2.0 (mmol/L)   O2 Saturation 87.3     Patient temperature 98.6     Collection site RIGHT RADIAL     Drawn by [id number]     Sample type ARTERIAL DRAW     Allens test (pass/fail) PASS  PASS    Lipid Panel No  results found for this basename: CHOL,TRIG,HDL,CHOLHDL,VLDL,LDLCALC in the last 72 hours  Studies/Results: Ct Head Wo Contrast  10/23/2011  *RADIOLOGY REPORT*  Clinical Data: New left hemiparesis, rule out stroke  CT HEAD WITHOUT CONTRAST  Technique:  Contiguous axial images were obtained from the base of the skull through the vertex without contrast.  Comparison: 06/03/2011  Findings: No skull fracture is noted.  Paranasal sinuses and mastoid air cells are unremarkable.  No intracranial hemorrhage, mass effect or midline shift.  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 Patchy small vessel disease within the deep white matter is stable from prior exam.  No definite acute cortical infarction.  No mass lesion is noted on this unenhanced scan. There is focal area of decreased attenuation in the right cerebellum medially measures 1.4 cm.  Subacute ischemia cannot be excluded.  Clinical  correlation is necessary.  IMPRESSION: No definite acute cortical infarct.  Stable white matter chronic small vessel ischemic changes. 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 Subacute ischemia cannot be excluded.  Clinical correlation is necessary.  Original Report Authenticated By: Natasha Mead, M.D.   Ct Angio Chest W/cm &/or Wo Cm  10/23/2011  *RADIOLOGY REPORT*  Clinical Data: Respiratory distress.  Evaluate for pulmonary embolism.  CT ANGIOGRAPHY CHEST  Technique:  Multidetector CT imaging of the chest using the standard protocol during bolus administration of intravenous contrast. Multiplanar reconstructed images including MIPs were obtained and reviewed to evaluate the vascular anatomy.  Contrast:  100 ml of Omnipaque 350.  Comparison: PET CT dated 07/07/2011.  Findings:  Mediastinum: There are no filling defects within the pulmonary arterial tree to suggest underlying pulmonary embolism. Heart size is borderline enlarged. There is no significant pericardial fluid, thickening or pericardial calcification. There is atherosclerosis of the thoracic aorta, the great vessels of the mediastinum and the coronary arteries, including calcified atherosclerotic plaque in the left anterior descending coronary arteries. 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 No pathologically enlarged mediastinal or hilar lymph nodes.  Lungs/Pleura: There is a large bore left-sided thoracostomy tube in place with tip in the apex of the left hemithorax.  There is a moderate-sized left-sided hydropneumothorax present.  Postoperative changes of wedge resection are noted in the periphery of the left upper lobe.  There is a background of severe centrilobular emphysema with mild diffuse bronchial wall thickening.  In the apex of the right upper lobe there are some areas of  architectural distortion there is somewhat nodular appearance, with the most prominent region measuring approximately 9 x 6 mm (on image 15 of series 5).  Previously noted left upper lobe nodule appears to have been surgically resected.  There is some airspace disease and architectural distortion in the adjacent left upper lobe, most compatible with some postoperative edema/hemorrhage.  Upper Abdomen: Multiple tiny subcentimeter low attenuation lesions are seen scattered throughout the hepatic parenchyma, unchanged compared to prior PET CT (these remain too small to definitively characterize).  Musculoskeletal: There are no aggressive appearing lytic or blastic lesions noted in the visualized portions of the skeleton.  Some gas is noted in the left chest wall musculature, related to chest tube placement.  IMPRESSION: 1.  No evidence of pulmonary embolism.  2.  Status post wedge resection in the left upper lobe with expected postoperative appearance, as above. 3.  There is a left-sided chest tube  in place with a moderate left- sided hydropneumothorax. 4.  There is an area of architectural distortion with some slight nodularity in the right upper lobe, including a subsolid nodule that measures approximately 9 x 6 mm.  This lesion is unchanged in appearance compared to recent PET CT dated 07/07/2011, and is favored to represent an area of postop post infectious/inflammatory scarring, however, an indolent neoplasm is difficult to entirely exclude.  Attention to this lesion on follow-up surveillance imaging is highly recommended. 5. Atherosclerosis, including left anterior descending coronary artery disease. Please note that although the presence of coronary artery calcium documents the presence of coronary artery disease, the severity of this disease and any potential stenosis cannot be assessed on this non-gated CT examination.  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  Assessment for potential risk factor modification, dietary therapy or pharmacologic therapy may be warranted, if clinically indicated. 6.  Background of severe centrilobular emphysema and mild diffuse bronchial wall thickening.  Original Report Authenticated By: Florencia Reasons, M.D.   Dg Chest Port 1 View  10/24/2011  *RADIOLOGY REPORT*  Clinical Data: Postop.  PORTABLE CHEST - 1 VIEW  Comparison: 10/23/2011  Findings: Decreasing size of the left pneumothorax with tiny residual left apical pneumothorax.  Left chest tube remains in place.  Airspace disease in the left upper lobe is stable.  Right lung is clear.  Right central line is stable.  Mild cardiomegaly.  IMPRESSION: Decreasing size of left pneumothorax.  Otherwise no change.  Original Report Authenticated By: Cyndie Chime, M.D.   Dg Chest Port 1 View  10/23/2011  *RADIOLOGY REPORT*  Clinical Data: Status post wedge resection.  PORTABLE CHEST - 1 VIEW  Comparison: Portable chest 10/22/2011.  Findings: A right IJ line is stable.  Left-sided chest tube is in place.  A small left apical pneumothorax is slightly larger than on the prior studies.  The heart is mildly enlarged.  Left upper lobe airspace disease remains concerning for pneumonia. There is some atelectasis at the left base.  The right lung is clear.  IMPRESSION:  1.  Slight increase and a left apical pneumothorax.  The chest tube remains in place. 2.  Persistent left upper lobe pneumonia. 3.  Atelectasis at the left base.  Original Report Authenticated By: Jamesetta Orleans. MATTERN, M.D.    Medications:     Scheduled:   . amLODipine  10 mg Oral Daily  . aspirin EC  81 mg Oral Daily  . atorvastatin  40 mg Oral Q supper  . bisacodyl  10 mg Oral Daily  . enoxaparin (LOVENOX) injection  40 mg Subcutaneous Q24H  . fentaNYL   Intravenous Q4H  . metoprolol succinate  100 mg Oral Daily  . white petrolatum        Assessment/Plan:   56 y.o. female with acute onset left hemiparesis.  Symptoms improved quickly. CT unremarkable for an acute event. New (from December 2012) area of hypodensity in the cerebellum. Embolic event can not be ruled out. Chest CT shows no evidence of PE. Patient remains back to baseline with no neurologic decline.   Recommend: 1) Continue ASA.   Neurology will sign off.    Felicie Morn PA-C [facility] [phone]  10/24/2011, 11:00 AM

## 2011-10-24 NOTE — Progress Notes (Signed)
4 Days Post-Op Procedure(s) (LRB): VIDEO ASSISTED THORACOSCOPY (VATS)/WEDGE RESECTION (Left) Subjective: Feels well this AM  Objective: Vital signs in last 24 hours: Temp:  [97.6 F (36.4 C)-98.5 F (36.9 C)] 98.5 F (36.9 C) (04/30 0330) Pulse Rate:  [72-91] 74  (04/30 0709) Cardiac Rhythm:  [-] Normal sinus rhythm (04/30 0330) Resp:  [17-25] 18  (04/30 0709) BP: (133-154)/(86-111) 138/93 mmHg (04/30 0709) SpO2:  [91 %-99 %] 98 % (04/30 0709) FiO2 (%):  [24 %-50 %] 24 % (04/30 0630)  Hemodynamic parameters for last 24 hours:    Intake/Output from previous day: 04/29 0701 - 04/30 0700 In: 600 [P.O.:360; I.V.:240] Out: 520 [Urine:450; Chest Tube:70] Intake/Output this shift:    General appearance: alert and no distress Neurologic: intact + air leak  Lab Results:  [facility] 10/22/11 0320  WBC 10.1  HGB 13.4  HCT 39.9  PLT 155   BMET:  Basename 10/23/11 0400 10/22/11 0320  NA 139 139  K 4.0 3.2*  CL 103 100  CO2 28 31  GLUCOSE 102* 102*  BUN 7 6  CREATININE 0.83 0.89  CALCIUM 8.8 9.0    PT/INR: No results found for this basename: LABPROT,INR in the last 72 hours ABG    Component Value Date/Time   PHART 7.435* 10/23/2011 1600   HCO3 27.1* 10/23/2011 1600   TCO2 28.3 10/23/2011 1600   O2SAT 87.3 10/23/2011 1600   CBG (last 3)   Basename 10/23/11 1513  GLUCAP 115*    Assessment/Plan: S/P Procedure(s) (LRB): VIDEO ASSISTED THORACOSCOPY (VATS)/WEDGE RESECTION (Left) no further neurologic symptoms  still has air leak Otherwise doing well   LOS: 4 days    Latreshia Beauchaine C 10/24/2011

## 2011-10-25 ENCOUNTER — Inpatient Hospital Stay (HOSPITAL_COMMUNITY): Payer: Self-pay

## 2011-10-25 MED ORDER — SODIUM CHLORIDE 0.9 % IJ SOLN
INTRAMUSCULAR | Status: AC
  Administered 2011-10-26: 10 mL
  Filled 2011-10-25: qty 10

## 2011-10-25 NOTE — Progress Notes (Signed)
5 Days Post-Op Procedure(s) (LRB): VIDEO ASSISTED THORACOSCOPY (VATS)/WEDGE RESECTION (Left) Subjective:  Margaret Mcmillan has no complaints this morning.   Objective: Vital signs in last 24 hours: Temp:  [98 F (36.7 C)-98.6 F (37 C)] 98.5 F (36.9 C) (05/01 0353) Pulse Rate:  [66-80] 80  (05/01 0353) Cardiac Rhythm:  [-] Normal sinus rhythm (05/01 0353) Resp:  [19-25] 22  (05/01 0755) BP: (111-131)/(67-93) 125/76 mmHg (05/01 0353) SpO2:  [96 %-100 %] 99 % (05/01 0755)  General appearance: alert, cooperative and no distress Heart: regular rate and rhythm Lungs: clear to auscultation bilaterally Abdomen: soft, non-tender; bowel sounds normal; no masses,  no organomegaly Extremities: edema trace Wound: clean and dry  Lab Results: No results found for this basename: WBC:2,HGB:2,HCT:2,PLT:2 in the last 72 hours BMET:  Basename 10/23/11 0400  NA 139  K 4.0  CL 103  CO2 28  GLUCOSE 102*  BUN 7  CREATININE 0.83  CALCIUM 8.8    PT/INR: No results found for this basename: LABPROT,INR in the last 72 hours ABG    Component Value Date/Time   PHART 7.435* 10/23/2011 1600   HCO3 27.1* 10/23/2011 1600   TCO2 28.3 10/23/2011 1600   O2SAT 87.3 10/23/2011 1600   CBG (last 3)   Basename 10/23/11 1513  GLUCAP 115*    Assessment/Plan: S/P Procedure(s) (LRB): VIDEO ASSISTED THORACOSCOPY (VATS)/WEDGE RESECTION (Left)  2. Chest tube- + air leak, Pneumothorax on CXR appears stable, will leave chest tube to water seal, further management per staff 3. Neurologic- patient back to baseline, neurology following, appreciate recs 4. Ambulation   LOS: 5 days    Lowella Dandy 10/25/2011

## 2011-10-25 NOTE — Progress Notes (Signed)
UR Completed.  Margaret Mcmillan [phone] 10/25/2011  

## 2011-10-26 ENCOUNTER — Inpatient Hospital Stay (HOSPITAL_COMMUNITY): Payer: Self-pay

## 2011-10-26 MED ORDER — LISINOPRIL 10 MG PO TABS
10.0000 mg | ORAL_TABLET | Freq: Every day | ORAL | Status: DC
  Administered 2011-10-26 – 2011-11-01 (×7): 10 mg via ORAL
  Filled 2011-10-26 (×7): qty 1

## 2011-10-26 NOTE — Progress Notes (Addendum)
                    [address]             [place] [address]          [phone]     6 Days Post-Op Procedure(s) (LRB): VIDEO ASSISTED THORACOSCOPY (VATS)/WEDGE RESECTION (Left)  Subjective: Feels well, no complaints.  Breathing stable.   Objective: Vital signs in last 24 hours: Patient Vitals for the past 24 hrs:  BP Temp Temp src Pulse Resp SpO2  10/26/11 0736 147/98 mmHg 98.5 F (36.9 C) Oral 68  18  99 %  10/26/11 0416 - - - - 22  99 %  10/26/11 0415 141/93 mmHg 98.6 F (37 C) Oral 68  20  98 %  10/25/11 2359 - - - - 21  98 %  10/25/11 2358 142/84 mmHg 98.6 F (37 C) Oral 61  17  100 %  10/25/11 2000 - - - 63  19  99 %  10/25/11 1942 - - - - 22  100 %  10/25/11 1847 121/79 mmHg 97.6 F (36.4 C) Oral 67  16  100 %  10/25/11 1601 - - - - 18  -  10/25/11 1600 127/74 mmHg 98.6 F (37 C) Oral - - -  10/25/11 1200 112/87 mmHg 98.4 F (36.9 C) Oral - - -  10/25/11 1124 - - - - 26  -  10/25/11 0932 134/95 mmHg - - 85  - -  10/25/11 0931 134/95 mmHg - - - - -  10/25/11 0800 134/95 mmHg 98.5 F (36.9 C) Oral - - -   Current Weight  10/20/11 56.1 kg (123 lb 10.9 oz)     Intake/Output from previous day: 05/01 0701 - 05/02 0700 In: 490 [P.O.:480; I.V.:10] Out: 40 [Chest Tube:40]    PHYSICAL EXAM:  Heart: RRR Lungs:clear Wound:clean and dry Chest tube: +1/7 air leak   CXR: stable L apical ptx  Assessment/Plan: S/P Procedure(s) (LRB): VIDEO ASSISTED THORACOSCOPY (VATS)/WEDGE RESECTION (Left) Stable air leak, small ptx on CXR. Continue CT to H2O seal for now. Neuro- stable, back to baseline. HTN- BPs trending up.  Continue Norvasc, Toprol, restart low dose ACE-I since Cr is stable. Continue IS/pulm toilet, ambulation.   LOS: 6 days    COLLINS,GINA H 10/26/2011  Feels well Hard to tell if there is a true air leak due to and fro motion i tubing Will clamp CT and recheck CXR in 2 hours- if OK will d/c CT

## 2011-10-27 ENCOUNTER — Inpatient Hospital Stay (HOSPITAL_COMMUNITY): Payer: Self-pay

## 2011-10-27 NOTE — Progress Notes (Signed)
7 Days Post-Op Procedure(s) (LRB): VIDEO ASSISTED THORACOSCOPY (VATS)/WEDGE RESECTION (Left) Subjective: No complaints  Objective: Vital signs in last 24 hours: Temp:  [97.8 F (36.6 C)-98.7 F (37.1 C)] 97.8 F (36.6 C) (05/03 0713) Pulse Rate:  [60-67] 61  (05/03 0713) Cardiac Rhythm:  [-] Normal sinus rhythm (05/03 0757) Resp:  [12-23] 12  (05/03 0757) BP: (100-147)/(66-93) 147/93 mmHg (05/03 0713) SpO2:  [95 %-100 %] 100 % (05/03 0757)  Hemodynamic parameters for last 24 hours:    Intake/Output from previous day: 05/02 0701 - 05/03 0700 In: 439.5 [P.O.:240; I.V.:199.5] Out: 10 [Chest Tube:10] Intake/Output this shift:    General appearance: alert and no distress Lungs: clear to auscultation bilaterally intermittent air leak  Lab Results: No results found for this basename: WBC:2,HGB:2,HCT:2,PLT:2 in the last 72 hours BMET: No results found for this basename: NA:2,K:2,CL:2,CO2:2,GLUCOSE:2,BUN:2,CREATININE:2,CALCIUM:2 in the last 72 hours  PT/INR: No results found for this basename: LABPROT,INR in the last 72 hours ABG    Component Value Date/Time   PHART 7.435* 10/23/2011 1600   HCO3 27.1* 10/23/2011 1600   TCO2 28.3 10/23/2011 1600   O2SAT 87.3 10/23/2011 1600   CBG (last 3)  No results found for this basename: GLUCAP:3 in the last 72 hours  Assessment/Plan: S/P Procedure(s) (LRB): VIDEO ASSISTED THORACOSCOPY (VATS)/WEDGE RESECTION (Left) - Air leak when 1st asked to cough, then resolved after dressing changed  ? Small air leak that had built up over night vs. Entraining air around tube.  Will recheck later today   LOS: 7 days    Lanasia Porras C 10/27/2011

## 2011-10-28 ENCOUNTER — Inpatient Hospital Stay (HOSPITAL_COMMUNITY): Payer: Self-pay

## 2011-10-28 NOTE — Progress Notes (Addendum)
                    [address]            [place] [address]          [phone]     8 Days Post-Op Procedure(s) (LRB): VIDEO ASSISTED THORACOSCOPY (VATS)/WEDGE RESECTION (Left)  Subjective: Feels well, no complaints.    Objective: Vital signs in last 24 hours: Patient Vitals for the past 24 hrs:  BP Temp Temp src Pulse Resp SpO2  10/28/11 0732 - - - - 19  99 %  10/28/11 0320 143/89 mmHg - - 62  22  100 %  10/27/11 2327 138/92 mmHg 98 F (36.7 C) Oral 75  23  99 %  10/27/11 2022 - - - - 15  -  10/27/11 2000 121/87 mmHg 98.2 F (36.8 C) Oral 66  15  100 %  10/27/11 1627 118/80 mmHg 98.6 F (37 C) Oral 61  - 98 %  10/27/11 1623 - - - - 18  98 %  10/27/11 1600 - - - 59  - 97 %  10/27/11 1211 125/85 mmHg 98.1 F (36.7 C) Oral 81  20  99 %  10/27/11 1106 - - - - 23  100 %  10/27/11 0757 - - - - 12  100 %  10/27/11 0756 - - - - 18  100 %   Current Weight  10/20/11 56.1 kg (123 lb 10.9 oz)     Intake/Output from previous day: 05/03 0701 - 05/04 0700 In: 1080 [P.O.:1080] Out: 50 [Chest Tube:50]    PHYSICAL EXAM:  Heart: RRR Lungs: clear Wound: clean and dry Chest tube: + air leak with cough  CXR: stable L apical ptx  Assessment/Plan: S/P Procedure(s) (LRB): VIDEO ASSISTED THORACOSCOPY (VATS)/WEDGE RESECTION (Left) Persistent air leak, CXR stable.  ?consider switching CT to [facility]. Continue pulm toilet, ambulation. Neuro-stable and at baseline.   LOS: 8 days    COLLINS,GINA H 10/28/2011   still with intermittent air leak Small apical ptx, try back to suction   Dg Chest Port 1 View  10/28/2011  *RADIOLOGY REPORT*  Clinical Data: Chest tube  PORTABLE CHEST - 1 VIEW  Comparison: 10/27/2011; 10/26/2011; 10/24/2011; chest CT - 10/23/2011  Findings: Grossly unchanged cardiac silhouette and mediastinal contours.  Stable position of support apparatus.  Unchanged small left apical pneumothorax.  Grossly unchanged linear heterogeneous  opacities about the left fissure.  Postsurgical changes of the left upper lobe.  No definite pleural effusion.  The right hemithorax is unchanged.  Grossly unchanged bones.  IMPRESSION: 1.  Stable positioning of support apparatus with unchanged small left apical pneumothorax. 2.  Stable postsurgical change of the left upper lobe with perihilar opacities favored to represent atelectasis.  Original Report Authenticated By: Waynard Reeds, M.D.    I have seen and examined [facility] and agree with the above assessment  and plan.  Delight Ovens MD Beeper [phone] Office [phone] 10/28/2011 11:22 AM

## 2011-10-29 ENCOUNTER — Inpatient Hospital Stay (HOSPITAL_COMMUNITY): Payer: Self-pay

## 2011-10-29 NOTE — Progress Notes (Signed)
                    [address]            [place], [address]          [phone]     9 Days Post-Op Procedure(s) (LRB): VIDEO ASSISTED THORACOSCOPY (VATS)/WEDGE RESECTION (Left)  Subjective: Comfortable, no complaints.  Objective: Vital signs in last 24 hours: Patient Vitals for the past 24 hrs:  BP Temp Temp src Pulse Resp SpO2  10/29/11 0300 137/80 mmHg 98.1 F (36.7 C) Oral 69  25  100 %  10/28/11 2327 130/80 mmHg - - 58  18  98 %  10/28/11 1920 108/75 mmHg 98.9 F (37.2 C) Oral 62  14  98 %  10/28/11 1600 - - - - 19  99 %  10/28/11 1545 129/93 mmHg 99.2 F (37.3 C) Oral 59  17  98 %  10/28/11 1500 - - - 69  19  100 %  10/28/11 1223 124/79 mmHg 98.8 F (37.1 C) Oral 61  21  100 %  10/28/11 0946 - - - - 23  100 %   Current Weight  10/20/11 56.1 kg (123 lb 10.9 oz)     Intake/Output from previous day: 05/04 0701 - 05/05 0700 In: 1200 [P.O.:1200] Out: 150 [Chest Tube:150]    PHYSICAL EXAM:  Heart: RRR Lungs: clear Wound: clean and dry Chest tube: 1/7 air leak   CXR: No significant change in small left apical pneumothorax.   Assessment/Plan: S/P Procedure(s) (LRB): VIDEO ASSISTED THORACOSCOPY (VATS)/WEDGE RESECTION (Left) Persistent air leak.  Continue CT to low suction today. Neuro stable. Continue current care.   LOS: 9 days    Darnise Montag H 10/29/2011

## 2011-10-30 ENCOUNTER — Inpatient Hospital Stay (HOSPITAL_COMMUNITY): Payer: Self-pay

## 2011-10-30 NOTE — Progress Notes (Signed)
10 Days Post-Op Procedure(s) (LRB): VIDEO ASSISTED THORACOSCOPY (VATS)/WEDGE RESECTION (Left) Subjective: Had confrontation with her daughter yesterday which upset her She feels better this AM  Objective: Vital signs in last 24 hours: Temp:  [98 F (36.7 C)-98.7 F (37.1 C)] 98.7 F (37.1 C) (05/05 2300) Pulse Rate:  [59-69] 60  (05/06 0809) Cardiac Rhythm:  [-] Normal sinus rhythm (05/06 0300) Resp:  [14-24] 19  (05/06 0809) BP: (115-152)/(73-94) 122/82 mmHg (05/06 0809) SpO2:  [98 %-100 %] 99 % (05/06 0809)  Hemodynamic parameters for last 24 hours:    Intake/Output from previous day: 05/05 0701 - 05/06 0700 In: 840 [P.O.:840] Out: 20 [Chest Tube:20] Intake/Output this shift:    General appearance: alert and no distress Lungs: clear to auscultation bilaterally small intermittent air leak  Lab Results: No results found for this basename: WBC:2,HGB:2,HCT:2,PLT:2 in the last 72 hours BMET: No results found for this basename: NA:2,K:2,CL:2,CO2:2,GLUCOSE:2,BUN:2,CREATININE:2,CALCIUM:2 in the last 72 hours  PT/INR: No results found for this basename: LABPROT,INR in the last 72 hours ABG    Component Value Date/Time   PHART 7.435* 10/23/2011 1600   HCO3 27.1* 10/23/2011 1600   TCO2 28.3 10/23/2011 1600   O2SAT 87.3 10/23/2011 1600   CBG (last 3)  No results found for this basename: GLUCAP:3 in the last 72 hours  Assessment/Plan: S/P Procedure(s) (LRB): VIDEO ASSISTED THORACOSCOPY (VATS)/WEDGE RESECTION (Left) - Still has small intermittent air leak. Will try clamping tube again today. Recheck CXR at noon If unable to get CT out in next 24-48 hours will plan to d/c with mini Xpress   LOS: 10 days    Virgene Tirone C 10/30/2011

## 2011-10-31 ENCOUNTER — Inpatient Hospital Stay (HOSPITAL_COMMUNITY): Payer: Self-pay

## 2011-10-31 DIAGNOSIS — G8921 Chronic pain due to trauma: Secondary | ICD-10-CM | POA: Insufficient documentation

## 2011-10-31 LAB — BASIC METABOLIC PANEL
BUN: 10 mg/dL (ref 6–23)
Calcium: 8.9 mg/dL (ref 8.4–10.5)
Chloride: 103 mEq/L (ref 96–112)
Creatinine, Ser: 0.95 mg/dL (ref 0.50–1.10)
GFR calc Af Amer: 76 mL/min — ABNORMAL LOW (ref >90)

## 2011-10-31 MED ORDER — OXYCODONE-ACETAMINOPHEN 5-325 MG PO TABS: 1.0000 | ORAL_TABLET | ORAL | Status: AC | PRN

## 2011-10-31 NOTE — Progress Notes (Addendum)
                    [address]            [place] [address]          [phone]     11 Days Post-Op Procedure(s) (LRB): VIDEO ASSISTED THORACOSCOPY (VATS)/WEDGE RESECTION (Left)  Subjective: Comfortable, breathing stable.  Objective: Vital signs in last 24 hours: Patient Vitals for the past 24 hrs:  BP Temp Temp src Pulse Resp SpO2  10/31/11 0400 124/75 mmHg 98.6 F (37 C) Oral 61  14  95 %  10/30/11 2345 122/83 mmHg 98.3 F (36.8 C) Oral 62  22  98 %  10/30/11 2000 109/86 mmHg 98.6 F (37 C) Oral 57  21  99 %  10/30/11 1548 126/90 mmHg 98.5 F (36.9 C) Oral - - -  10/30/11 1500 - - - 72  17  99 %  10/30/11 1355 - - - - 20  100 %  10/30/11 1254 - - - - 20  100 %  10/30/11 1217 125/73 mmHg 99 F (37.2 C) Oral 54  17  99 %  10/30/11 0809 122/82 mmHg 98.9 F (37.2 C) Oral 60  19  99 %   Current Weight  10/20/11 56.1 kg (123 lb 10.9 oz)     Intake/Output from previous day: 05/06 0701 - 05/07 0700 In: 980 [P.O.:720; I.V.:260] Out: 0     PHYSICAL EXAM:  Heart: RRR Lungs:clear Wound: clean and dry Chest tube clamped   Lab Results: CBC:No results found for this basename: WBC:2,HGB:2,HCT:2,PLT:2 in the last 72 hours BMET:  [facility] 10/31/11 0345  NA 138  K 3.9  CL 103  CO2 27  GLUCOSE 95  BUN 10  CREATININE 0.95  CALCIUM 8.9    CXR: stable L apical ptx  Assessment/Plan: S/P Procedure(s) (LRB): VIDEO ASSISTED THORACOSCOPY (VATS)/WEDGE RESECTION (Left) CXR stable with CT clamped.   Will d/w MD. May be able to switch to Mini-Express vs d/c CT.   LOS: 11 days    COLLINS,GINA H 10/31/2011  Patient seen and examined. Agree with above. She tolerated CT clamping x 24 hours with minimal change in CXR and no respiratory symptoms. Will d/c CT

## 2011-10-31 NOTE — Discharge Summary (Signed)
[address]            [place] [address]          [phone]         Discharge Summary  Name: Margaret Mcmillan Flight DOB: May 08, 1956 56 y.o. MRN: [id number]  Admission Date: 10/20/2011 Discharge Date:    Admitting Diagnosis: Left upper lobe lung nodule  Discharge Diagnosis:  Necrotizing granulomas, left upper lobe Hypertension COPD History of cocaine abuse Arthritis Prior history of tobacco use Prolonged air leak   Procedures: LEFT VIDEO ASSISTED THORACOSCOPY (VATS)/LEFT UPPER LOBE WEDGE RESECTION on 10/20/2011   HPI:  The patient is a 56 y.o. female with a left lung nodule.  She was being evaluated for cold and flulike symptoms about a year ago. A chest x-ray was done which showed a possible left upper lobe nodule. This led to a CT scan 07/18/2010 which showed an irregular nodule in the left upper lobe. She had a followup on PET CT done on January 11 13. This did not show any significant change in size the nodule, there was mild hypermetabolic activity associated with a nodule with an SUV of 2.0.  A thoracic surgical consult was requested and the patient was seen by Dr. Dorris Fetch. She was offered continued CT followup versus wedge resection for definitive diagnosis, and strongly wishes to have the nodule out. All risks, benefits and alternatives of surgery were explained in detail, and the patient agreed to proceed.    Hospital Course:  The patient was admitted to [facility] on 10/20/2011. The patient was taken to the operating room and underwent the above procedure.    The postoperative course was notable in the initial period for an episode of altered mental status with associated tachypnea and decreased O2 sats. The patient reported difficulty using her left hand during the episode. Her mental status did return quickly to baseline. 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 A neurology consult was requested. The patient also underwent a chest CT which was negative for pulmonary embolus. It was postulated that her event could have been secondary to an air embolus which occurred during the flushing of her central line. Since her symptoms had returned to baseline it was felt that she would require no further therapy and just careful monitoring at this point.   She has also had a prolonged air leak in her chest tube postoperatively. She initially was treated with suction and this has been weaned down to water seal. Her chest x-ray has remained stable with a small apical pneumothorax on the left. Her chest tube was clamped for the last 24 hours and her x-ray remained stable, therefore her chest tube has been discontinued. A followup chest x-ray is pending and a PA and lateral chest x-ray will be performed on 11/01/2011.  The patient has otherwise done well postoperatively. She was restarted on her home antihypertensive medications and her blood pressures have been fairly well controlled. Her pulmonary status has remained stable. She's tolerating a regular diet and ambulating in the halls without difficulty. Her final pathology was positive for necrotizing granulomatous disease with no evidence of malignancy. We anticipate discharge home within the next 24 hours provided her x-ray remains stable and no other acute changes  have occurred.    Recent vital signs:  Filed Vitals:   10/31/11 0400  BP: 124/75  Pulse: 61  Temp: 98.6 F (37 C)  Resp: 14    Recent laboratory studies:  CBC:No results found for this basename: WBC:2,HGB:2,HCT:2,PLT:2 in the last 72 hours BMET:  Basename 10/31/11 0345  NA 138  K 3.9  CL 103  CO2 27  GLUCOSE 95  BUN 10  CREATININE 0.95  CALCIUM 8.9    PT/INR: No results found for this basename: LABPROT,INR in the last 72 hours  Discharge Medications:   Medication List   As of 10/31/2011  8:43 AM   TAKE these medications         amLODipine 10 MG tablet   Commonly known as: NORVASC   Take 10 mg by mouth daily.      aspirin 81 MG EC tablet   Take 81 mg by mouth daily.      atorvastatin 40 MG tablet   Commonly known as: LIPITOR   Take 40 mg by mouth daily.      hydrochlorothiazide 25 MG tablet   Commonly known as: HYDRODIURIL   Take 1 tablet (25 mg total) by mouth daily.      metoprolol succinate 100 MG 24 hr tablet   Commonly known as: TOPROL-XL   Take 1 tablet (100 mg total) by mouth daily. Take with or immediately following a meal.      oxyCODONE-acetaminophen 5-325 MG per tablet   Commonly known as: PERCOCET   Take 1-2 tablets by mouth every 4 (four) hours as needed for pain.      quinapril 10 MG tablet   Commonly known as: ACCUPRIL   Take 2 tablets (20 mg total) by mouth 2 (two) times daily.            Discharge Instructions:  The patient is to refrain from driving, heavy lifting or strenuous activity.  May shower daily and clean incisions with soap and water.  May resume regular diet.  Discharge Orders    Future Appointments: Provider: Department: Dept Phone: Center:   11/06/2011 9:45 AM [facility] [phone]      Follow-up Information    Follow up with Loreli Slot, MD. (Office will call to schedule appointment)    Contact information:   [address] [place] [address] [phone]           COLLINS,GINA H 10/31/2011, 8:43 AM

## 2011-11-01 ENCOUNTER — Inpatient Hospital Stay (HOSPITAL_COMMUNITY): Payer: Self-pay

## 2011-11-01 NOTE — Care Management Note (Signed)
    Page 1 of 1   11/01/2011     2:39:03 PM   CARE MANAGEMENT NOTE 11/01/2011  Patient:  Margaret Mcmillan, Margaret Mcmillan   Account Number:  [id number]  Date Initiated:  10/25/2011  Documentation initiated by:  [facility]  Subjective/Objective Assessment:   VATS on 4-26-  has spouse.     Action/Plan:   Anticipated DC Date:  10/27/2011   Anticipated DC Plan:  HOME W HOME HEALTH SERVICES      DC Planning Services  CM consult  Medication Assistance      Choice offered to / List presented to:             Status of service:  Completed, signed off Medicare Important Message given?   (If response is "NO", the following Medicare IM given date fields will be blank) Date Medicare IM given:   Date Additional Medicare IM given:    Discharge Disposition:  HOME/SELF CARE  Per UR Regulation:  Reviewed for med. necessity/level of care/duration of stay  If discussed at Long Length of Stay Meetings, dates discussed:    Comments:  11/01/11- 1000- Donn Pierini RN, BSN [phone] Pt for discharge today, no d/c needs identified.

## 2011-11-01 NOTE — Progress Notes (Addendum)
                    [address]            [place] [address]          [phone]     12 Days Post-Op Procedure(s) (LRB): VIDEO ASSISTED THORACOSCOPY (VATS)/WEDGE RESECTION (Left)  Subjective: Feels well, no complaints. Breathing stable.  Objective: Vital signs in last 24 hours: Patient Vitals for the past 24 hrs:  BP Temp Temp src Pulse Resp SpO2  11/01/11 0400 151/95 mmHg 97.3 F (36.3 C) Oral 53  14  97 %  11/01/11 0000 - - - 53  14  94 %  10/31/11 1912 124/78 mmHg 98.1 F (36.7 C) Oral 63  14  100 %  10/31/11 1600 122/74 mmHg 97.8 F (36.6 C) Oral - - -  10/31/11 1200 131/82 mmHg 98.1 F (36.7 C) Oral - - -  10/31/11 0926 131/89 mmHg - - 67  - -  10/31/11 0800 131/89 mmHg 98.2 F (36.8 C) Oral - - -   Current Weight  10/20/11 56.1 kg (123 lb 10.9 oz)     Intake/Output from previous day: 05/07 0701 - 05/08 0700 In: 980 [P.O.:960; I.V.:20] Out: 0     PHYSICAL EXAM:  Heart: RRR Lungs: clear Wound: clean and dry   Lab Results: CBC:No results found for this basename: WBC:2,HGB:2,HCT:2,PLT:2 in the last 72 hours BMET:  [facility] 10/31/11 0345  NA 138  K 3.9  CL 103  CO2 27  GLUCOSE 95  BUN 10  CREATININE 0.95  CALCIUM 8.9    CXR: small L apical/basilar ptx, stable since yesterday  Assessment/Plan: S/P Procedure(s) (LRB): VIDEO ASSISTED THORACOSCOPY (VATS)/WEDGE RESECTION (Left) CXR stable post-CT removal. Plan d/c home today- instructions reviewed with patient.   LOS: 12 days    COLLINS,GINA H 11/01/2011  Patient seen and examined. Agree with above

## 2011-11-01 NOTE — Progress Notes (Signed)
Patient being discharged home today per MD order,All discharge instructions were given and both patient and husband voiced understanding of instructions.Iv line was d'cd and charted.Dressing was clean, dry, and intact.

## 2011-11-01 NOTE — Plan of Care (Signed)
Problem: Phase III Progression Outcomes Goal: Transferred to Telemetry Outcome: Not Applicable Date Met:  11/01/11 Will be discharged home from 3300.  Problem: Discharge Progression Outcomes Goal: Chest tube D/C'd Outcome: Completed/Met Date Met:  11/01/11 Last chest tube d'cd 10/31/11

## 2011-11-06 ENCOUNTER — Ambulatory Visit (HOSPITAL_COMMUNITY): Admission: RE | Admit: 2011-11-06 | Payer: Self-pay | Source: Ambulatory Visit

## 2011-11-09 ENCOUNTER — Other Ambulatory Visit: Payer: Self-pay | Admitting: Thoracic Surgery (Cardiothoracic Vascular Surgery)

## 2011-11-09 DIAGNOSIS — R911 Solitary pulmonary nodule: Secondary | ICD-10-CM

## 2011-11-13 ENCOUNTER — Other Ambulatory Visit: Payer: Self-pay | Admitting: *Deleted

## 2011-11-13 MED ORDER — AMLODIPINE BESYLATE 10 MG PO TABS: 10.0000 mg | ORAL_TABLET | Freq: Every day | ORAL | Status: DC

## 2011-11-13 NOTE — Telephone Encounter (Signed)
Rx phoned to pharmacy.  

## 2011-11-14 ENCOUNTER — Ambulatory Visit (INDEPENDENT_AMBULATORY_CARE_PROVIDER_SITE_OTHER): Payer: Self-pay | Admitting: Thoracic Surgery (Cardiothoracic Vascular Surgery)

## 2011-11-14 ENCOUNTER — Encounter: Payer: Self-pay | Admitting: Thoracic Surgery (Cardiothoracic Vascular Surgery)

## 2011-11-14 ENCOUNTER — Ambulatory Visit
Admission: RE | Admit: 2011-11-14 | Discharge: 2011-11-14 | Disposition: A | Discharge status: Home / Self Care | Payer: No Typology Code available for payment source | Source: Ambulatory Visit | Attending: Thoracic Surgery (Cardiothoracic Vascular Surgery) | Admitting: Thoracic Surgery (Cardiothoracic Vascular Surgery)

## 2011-11-14 VITALS — BP 127/89 | HR 56 | Resp 18 | Ht 70.0 in | Wt 118.0 lb

## 2011-11-14 DIAGNOSIS — R911 Solitary pulmonary nodule: Secondary | ICD-10-CM

## 2011-11-14 DIAGNOSIS — Z9889 Other specified postprocedural states: Secondary | ICD-10-CM

## 2011-11-14 NOTE — Progress Notes (Signed)
  HPI:  Margaret Mcmillan returns for a scheduled followup visit. She had left video-assisted thoracoscopy and wedge resection of left upper lobe nodule on 10/20/2011. This turned out to be granulomatous disease. Postoperatively she had a prolonged air leak, which ultimately resolved without any intervention. She also had a transient ischemic attack, possibly related to manipulation of her central line. She had a full recovery from that within about an hour. Since she's been home she says she's been doing well. She's not take any pain medication any longer. She's not had any neurologic symptoms. Past Medical History  Diagnosis Date  . Shoulder pain     Started at July, 2012  . Lipoma of shoulder   . Tobacco abuse     Since 56 year old  . Cocaine abuse   . Headache   . Arthritis left arm  . No pertinent past medical history   . COPD (chronic obstructive pulmonary disease) 09/02/2011  . Hypertension     takes Amlodipine,HCTZ,and Metoprolol daily  . Hyperlipidemia     takes Lipitor daily  . Dysrhythmia     takes Metoprolol daily  . Bronchitis     hx of  . Lung mass     left upper lobe  . Constipation     related to medications and takes OTC stoof softener PRN  . Blood transfusion 2001 or 2002      Current Outpatient Prescriptions  Medication Sig Dispense Refill  . amLODipine (NORVASC) 10 MG tablet Take 1 tablet (10 mg total) by mouth daily.  30 tablet  3  . aspirin 81 MG EC tablet Take 81 mg by mouth daily.      Marland Kitchen atorvastatin (LIPITOR) 40 MG tablet Take 40 mg by mouth daily.      . hydrochlorothiazide (HYDRODIURIL) 25 MG tablet Take 1 tablet (25 mg total) by mouth daily.  30 tablet  6  . metoprolol succinate (TOPROL-XL) 100 MG 24 hr tablet Take 1 tablet (100 mg total) by mouth daily. Take with or immediately following a meal.  30 tablet  11  . oxyCODONE-acetaminophen (PERCOCET) 10-325 MG per tablet Take 1 tablet by mouth every 4 (four) hours as needed.      . quinapril (ACCUPRIL) 10 MG  tablet Take 2 tablets (20 mg total) by mouth 2 (two) times daily.  180 tablet  3  . DISCONTD: metoprolol tartrate (LOPRESSOR) 25 MG tablet Take 1 tablet (25 mg total) by mouth 2 (two) times daily.  60 tablet  11    Physical Exam BP 127/89  Pulse Margaret  Resp 18  Ht 5\' 10"  (1.778 m)  Wt 118 lb (53.524 kg)  BMI 16.93 kg/m2  SpO2 18% Thin Margaret Mcmillan in no acute distress Lungs clear Incisions healing well  Diagnostic Tests: Chest x-ray shows postoperative changes  Impression: Margaret Mcmillan status post left VATS and wedge resection. She's doing very well. She has minimal discomfort and is anxious to increase her activities. Her pathology showed granulomatous disease. Stains were negative for fungus and AFB, cultures are still pending.   Plan:  Doing well from a surgical standpoint. She has a followup appointment with Dr. Vassie Loll in mid June. I will be happy to see her back any time in the future if I can be of any further assistance with her care

## 2011-11-17 LAB — FUNGUS CULTURE W SMEAR: Fungal Smear: NONE SEEN

## 2011-11-22 ENCOUNTER — Ambulatory Visit (INDEPENDENT_AMBULATORY_CARE_PROVIDER_SITE_OTHER): Payer: Self-pay | Admitting: Internal Medicine

## 2011-11-22 ENCOUNTER — Encounter: Payer: Self-pay | Admitting: Internal Medicine

## 2011-11-22 VITALS — BP 140/97 | HR 49 | Temp 97.0°F | Wt 119.8 lb

## 2011-11-22 DIAGNOSIS — I10 Essential (primary) hypertension: Secondary | ICD-10-CM

## 2011-11-22 DIAGNOSIS — R918 Other nonspecific abnormal finding of lung field: Secondary | ICD-10-CM

## 2011-11-22 NOTE — Patient Instructions (Signed)
Please make followup appointment in 3-4 months. Call for early appointment meanwhile if needed. Please take all medications as you do. followup with Dr. Vassie Loll.  If your blood pressure stays high, call us for further recommendations or early appointment.

## 2011-11-23 ENCOUNTER — Encounter: Payer: Self-pay | Admitting: Internal Medicine

## 2011-11-23 NOTE — Progress Notes (Signed)
  Subjective:    Patient ID: Margaret Mcmillan, female    DOB: 1955-10-05, 56 y.o.   MRN: [id number]  HPI Margaret Mcmillan is pleasant 31 year woman with past history significant for pulmonary nodules, history of cigarette smoking and uncontrolled hypertension who comes the clinic for followup visit after her left upper lobectomy. She was found to have pulmonary nodules in left upper lobe around end of last year, for which she was extensively evaluated by pulmonologist and cardiothoracic surgeon- and was given the option to follow the nodule or have surgery- from which she opted to have surgery. She got her left upper lobectomy on 10/20/2011 by Dr. Dorris Fetch- which went well and surgical pathology shows granuloma with no malignancy. She followed up with Dr. Dorris Fetch in middle of May and was doing perfectly all right. She will see him as needed basis now. She has an appointment coming up with Dr. Vassie Loll in July for followup.  She is feeling good. Has no new complaints. Feels better after surgery. She is happy to know that is not cancer. Denies any chest pain, short of breath, nausea vomiting, abdominal pain, diarrhea, headache.    Review of Systems    As per history of present illness, all other systems reviewed and negative. Objective:   Physical Exam  General: NAD HEENT: PERRL, EOMI, no scleral icterus Cardiac: RRR, no rubs, murmurs or gallops Pulm: clear to auscultation bilaterally, moving normal volumes of air Abd: soft, nontender, nondistended, BS present Ext: warm and well perfused, no pedal edema Neuro: alert and oriented X3, cranial nerves II-XII grossly intact       Assessment & Plan:

## 2011-11-23 NOTE — Assessment & Plan Note (Signed)
Patient left upper lobectomy per Dr. Dorris Fetch on October 20 2011 . Pathology shows necrotizing granuloma and no malignancy. Discussed this with patient and give printed report of pathology. Patient seems happy and excited. She'll be followed with Dr. Vassie Loll in July for pulmonology followup postsurgery. She will followup with Dr. Dorris Fetch as needed in future. No more lung cancer screening (followup) needed at present.

## 2011-11-23 NOTE — Assessment & Plan Note (Signed)
Lab Results  Component Value Date   NA 138 10/31/2011   K 3.9 10/31/2011   CL 103 10/31/2011   CO2 27 10/31/2011   BUN 10 10/31/2011   CREATININE 0.95 10/31/2011   CREATININE 1.13* 10/02/2011    BP Readings from Last 3 Encounters:  11/22/11 140/97  11/14/11 127/89  11/01/11 128/90    Assessment: Hypertension control:  mildly elevated  Progress toward goals:  unchanged Barriers to meeting goals:  no barriers identified  Plan: Hypertension treatment:  continue current medications

## 2011-11-28 ENCOUNTER — Ambulatory Visit (HOSPITAL_COMMUNITY)
Admission: RE | Admit: 2011-11-28 | Discharge: 2011-11-28 | Disposition: A | Discharge status: Home / Self Care | Payer: Self-pay | Source: Ambulatory Visit | Attending: Internal Medicine | Admitting: Internal Medicine

## 2011-11-28 DIAGNOSIS — Z1231 Encounter for screening mammogram for malignant neoplasm of breast: Secondary | ICD-10-CM | POA: Insufficient documentation

## 2011-11-29 ENCOUNTER — Other Ambulatory Visit: Payer: Self-pay | Admitting: Internal Medicine

## 2011-11-29 DIAGNOSIS — N6310 Unspecified lump in the right breast, unspecified quadrant: Secondary | ICD-10-CM

## 2011-11-30 NOTE — Progress Notes (Signed)
Appt scheduled at [facility]. June 13 at 1:20PM; pt was called.

## 2011-12-03 LAB — AFB CULTURE WITH SMEAR (NOT AT ARMC)

## 2011-12-05 ENCOUNTER — Other Ambulatory Visit: Payer: Self-pay | Admitting: *Deleted

## 2011-12-07 ENCOUNTER — Ambulatory Visit
Admission: RE | Admit: 2011-12-07 | Discharge: 2011-12-07 | Disposition: A | Discharge status: Home / Self Care | Payer: Self-pay | Source: Ambulatory Visit | Attending: Internal Medicine | Admitting: Internal Medicine

## 2011-12-07 DIAGNOSIS — N6310 Unspecified lump in the right breast, unspecified quadrant: Secondary | ICD-10-CM

## 2011-12-07 NOTE — Telephone Encounter (Signed)
Please correct the # she gets every refill.  If it's 4 pills a day = 120  For 3 months she needs 360??

## 2011-12-10 ENCOUNTER — Other Ambulatory Visit: Payer: Self-pay | Admitting: Internal Medicine

## 2011-12-10 MED ORDER — QUINAPRIL HCL 10 MG PO TABS: 20.0000 mg | ORAL_TABLET | Freq: Two times a day (BID) | ORAL | Status: DC

## 2011-12-10 NOTE — Telephone Encounter (Signed)
Changed to 360 tablets.  Thanks. Daleen Bo

## 2011-12-12 NOTE — Telephone Encounter (Signed)
rX CALLED IN

## 2011-12-14 NOTE — Progress Notes (Signed)
Rx called in to pharmacy. Stanton Kidney Taniya Dasher RN 12/14/11 9:10AM

## 2012-01-08 ENCOUNTER — Other Ambulatory Visit: Payer: Self-pay

## 2012-01-08 ENCOUNTER — Ambulatory Visit (INDEPENDENT_AMBULATORY_CARE_PROVIDER_SITE_OTHER): Payer: Self-pay | Admitting: Pulmonary Disease

## 2012-01-08 ENCOUNTER — Encounter: Payer: Self-pay | Admitting: Pulmonary Disease

## 2012-01-08 VITALS — BP 132/86 | HR 62 | Temp 98.4°F | Ht 68.0 in | Wt 130.4 lb

## 2012-01-08 DIAGNOSIS — R918 Other nonspecific abnormal finding of lung field: Secondary | ICD-10-CM

## 2012-01-08 DIAGNOSIS — J449 Chronic obstructive pulmonary disease, unspecified: Secondary | ICD-10-CM

## 2012-01-08 NOTE — Progress Notes (Signed)
  Subjective:    Patient ID: Margaret Mcmillan, female    DOB: Oct 15, 1955, 56 y.o.   MRN: [id number]  HPI 56/F, ex smoker & cocaine user, for evaluation of LUL nodule, first noted in jan'12  CT chest in jan'12 showed a pulmonary nodule Within the superior segment of the right lower lobe which measured 1.5 x 0.6 x 1.2 cm.  Small nodule is identified within the right base measuring 0.5 cm.  There are several small low density lesions within the right hepatic lobe. These are too small to characterize measuring up to 6 mm.  FU PET scan in 1/13 showed a spiculated oval nodule in the posterior left upper lobe is  unchanged in size since 07/18/2010. The lesion did demonstrate low-level F D G uptake with SUV max = 2  She smoked 40 pack years before quitting in October 2012   09/01/2011  PFTs >> fev1 69% -2.0L, smaller airways worse, lung volumes preserved & dlco 70%    01/08/2012 She had left video-assisted thoracoscopy and wedge resection of left upper lobe nodule on 10/20/2011. This showed necrotizing granulomas - afb , fungal neg. Postoperatively she had a prolonged air leak, which ultimately resolved without any intervention. She also had a transient ischemic attack, possibly related to manipulation of her central line. She had a full recovery from that within about an hour.  She denies weight loss, fevers, night sweats or hemoptysis       Review of Systems Patient denies significant dyspnea,cough, hemoptysis,  chest pain, palpitations, pedal edema, orthopnea, paroxysmal nocturnal dyspnea, lightheadedness, nausea, vomiting, abdominal or  leg pains      Objective:   Physical Exam  Gen. Pleasant, thin woman, in no distress ENT - no lesions, no post nasal drip Neck: No JVD, no thyromegaly, no carotid bruits Lungs: no use of accessory muscles, no dullness to percussion, clear without rales or rhonchi  Cardiovascular: Rhythm regular, heart sounds  normal, no murmurs or gallops, no peripheral  edema Musculoskeletal: No deformities, no cyanosis or clubbing        Assessment & Plan:

## 2012-01-08 NOTE — Patient Instructions (Addendum)
You had granulomas on the lung biopsy - this does not need treatment at this time , but observation Blood work today You have mild COPd- lung capacity is at 70%

## 2012-01-10 NOTE — Assessment & Plan Note (Signed)
Unclear cause of - necrotizing granuloma. ACE level low - unlikely sarcoid as isolated nodule Will obtain Interferon GOLD assay for LTBI for completion - no evidence of active Tb Smoking cessation again emphasized She hs recovered well from surgery. WIll likely need surveillance CT in 1/14

## 2012-02-21 ENCOUNTER — Encounter: Payer: Self-pay | Admitting: Internal Medicine

## 2012-02-21 ENCOUNTER — Ambulatory Visit (INDEPENDENT_AMBULATORY_CARE_PROVIDER_SITE_OTHER): Payer: Self-pay | Admitting: Internal Medicine

## 2012-02-21 VITALS — BP 143/98 | HR 46 | Temp 97.8°F | Ht 68.0 in | Wt 130.8 lb

## 2012-02-21 DIAGNOSIS — R918 Other nonspecific abnormal finding of lung field: Secondary | ICD-10-CM

## 2012-02-21 DIAGNOSIS — F17200 Nicotine dependence, unspecified, uncomplicated: Secondary | ICD-10-CM

## 2012-02-21 DIAGNOSIS — I10 Essential (primary) hypertension: Secondary | ICD-10-CM

## 2012-02-21 NOTE — Assessment & Plan Note (Signed)
Quit cigarette smoking last year in December. Not yet restarted.

## 2012-02-21 NOTE — Patient Instructions (Signed)
Please make a followup appointment in 3-4 months.  Cut back on metoprolol to 50 mg daily as your heart rate was in the 40s today.  And taking all the medications at the same dose.  If you have any dizziness, passing out spell, call for early appointment.

## 2012-02-21 NOTE — Progress Notes (Signed)
  Subjective:    Patient ID: Margaret Mcmillan, female    DOB: 18-Oct-1955, 56 y.o.   MRN: [id number]  HPI Margaret Mcmillan  is a pleasant 56 year old woman with past history of COPD, pulmonary nodules - status post left upper lobe wedge resection- biopsy showing necrotizing granuloma , hypertension, comes the clinic for regular followup visit.   she underwent left upper wedge resection for further nodule in April 2013 and is recovering from that really well. The biopsy shows necrotizing granuloma - ACE level- 1, not elevated . Will check Quantiferon gold today -is somehow it was canceled during last visit with Dr. Vassie Loll.  She is feeling perfectly all right . Does not have any fever, chills, nausea vomiting or abdominal pain, chest pain, short of breath, headaches, palpitations.   Her heart rate is 46-50 today , but she does not have any dizziness, syncope, headache, fall, unsteadiness etc.      Review of Systems     as per history of present illness, all other systems reviewed and negative.  Objective:   Physical Exam  General: NAD HEENT: PERRL, EOMI, no scleral icterus Cardiac: S1, S2, regular, bradycardic, no rubs, murmurs or gallops Pulm: clear to auscultation bilaterally, moving normal volumes of air Abd: soft, nontender, nondistended, BS present Ext: warm and well perfused, no pedal edema Neuro: alert and oriented X3, cranial nerves II-XII grossly intact       Assessment & Plan:

## 2012-02-21 NOTE — Assessment & Plan Note (Addendum)
Blood pressure mildly elevated today. Heart rate in 40s- although patient is not symptomatic. Discussed with Dr. Josem Kaufmann- and will decrease the dose of metoprolol to 50 mg daily instead of 100 daily. Instructed this to the patient.  Otherwise continue taking quinapril, Norvasc and HCTZ at previous doses.

## 2012-03-25 DIAGNOSIS — Z0271 Encounter for disability determination: Secondary | ICD-10-CM

## 2012-04-22 ENCOUNTER — Other Ambulatory Visit: Payer: Self-pay | Admitting: *Deleted

## 2012-04-22 MED ORDER — AMLODIPINE BESYLATE 10 MG PO TABS: 10.0000 mg | ORAL_TABLET | Freq: Every day | ORAL | Status: AC

## 2012-04-23 NOTE — Telephone Encounter (Signed)
Called to pharm 

## 2012-05-14 ENCOUNTER — Other Ambulatory Visit: Payer: Self-pay | Admitting: Internal Medicine

## 2012-05-22 ENCOUNTER — Ambulatory Visit (INDEPENDENT_AMBULATORY_CARE_PROVIDER_SITE_OTHER): Payer: Self-pay | Admitting: Internal Medicine

## 2012-05-22 ENCOUNTER — Encounter: Payer: Self-pay | Admitting: Internal Medicine

## 2012-05-22 VITALS — BP 207/131 | HR 53 | Temp 98.6°F | Ht 66.0 in | Wt 139.1 lb

## 2012-05-22 DIAGNOSIS — R911 Solitary pulmonary nodule: Secondary | ICD-10-CM

## 2012-05-22 DIAGNOSIS — F17200 Nicotine dependence, unspecified, uncomplicated: Secondary | ICD-10-CM

## 2012-05-22 DIAGNOSIS — R918 Other nonspecific abnormal finding of lung field: Secondary | ICD-10-CM

## 2012-05-22 DIAGNOSIS — J449 Chronic obstructive pulmonary disease, unspecified: Secondary | ICD-10-CM

## 2012-05-22 DIAGNOSIS — Z23 Encounter for immunization: Secondary | ICD-10-CM

## 2012-05-22 DIAGNOSIS — Z0000 Encounter for general adult medical examination without abnormal findings: Secondary | ICD-10-CM

## 2012-05-22 DIAGNOSIS — I10 Essential (primary) hypertension: Secondary | ICD-10-CM

## 2012-05-22 NOTE — Progress Notes (Signed)
  Subjective:    Patient ID: Margaret Mcmillan, female    DOB: 01-Dec-1955, 56 y.o.   MRN: [id number]  HPI patient is a pleasant 56 year woman with hypertension, left upper pulmonary nodule status post resection an ex-smoker who comes to the clinic for health maintenance 56 clinic visit.  She had her mammogram done recently. Her last Pap smear was done in 2003. She got TdaP today. Refused flu vaccination. Discussed about colonoscopy and scheduled it in January 2014. Will get Pap smear during next visit in January with me.  Patient overall feels perfectly all right. Denies any new problems or issues. Feels great after surgery. Denies any fever, chills, nausea vomiting, abdominal pain, chest pain, short of breath, diarrhea.  Her blood pressure today is 207/131- severely elevated. Patient says she is out of her Norvasc for about a week. She called the pharmacy about a week before but did not she was seen back from them yet. She denies any headache, vision changes.  Review of Systems    as per history of present illness. Objective:   Physical Exam  General:  NAD HEENT: PERRL, EOMI, no scleral icterus Cardiac: S1, S2, RRR, no rubs, murmurs or gallops Pulm: clear to auscultation bilaterally, moving normal volumes of air Abd: soft, nontender, nondistended, BS present Ext: warm and well perfused, no pedal edema Neuro: alert and oriented X3, cranial nerves II-XII grossly intact        Assessment & Plan:

## 2012-05-22 NOTE — Patient Instructions (Signed)
Please make followup appointment in 10-14 days for blood pressure check. After that make an appointment with me in third or fourth week of January. Pick up the prescription for Norvasc today. Continue taking all the medications as you do. Congratulations for quitting smoking for almost a year now.  Get the colonoscopy done in January 2014.  We will get the Pap smear in January when you come back to see me.

## 2012-05-22 NOTE — Assessment & Plan Note (Signed)
Abstinence from smoking for about one year. Congratulated again and encouraged to stay with him smoking.  She is highly motivated and says that she will never go back to smoking

## 2012-05-22 NOTE — Assessment & Plan Note (Signed)
Unclear etiology of patient's necrotizing granuloma. ACE levels normal, quantiferon Gold negative for TB.  Discussed these results with patient today.  - Will need surveillance CT chest in January 2014 per Dr. Vassie Loll. - Patient to see pulmonology on 07/10/2012.

## 2012-05-22 NOTE — Assessment & Plan Note (Signed)
Lab Results  Component Value Date   NA 138 10/31/2011   K 3.9 10/31/2011   CL 103 10/31/2011   CO2 27 10/31/2011   BUN 10 10/31/2011   CREATININE 0.95 10/31/2011   CREATININE 1.13* 10/02/2011    BP Readings from Last 3 Encounters:  05/22/12 207/131  02/21/12 143/98  01/08/12 132/86    Assessment: Hypertension control:  severely elevated  Progress toward goals:  deteriorated Barriers to meeting goals:  Out of Norvasc for a week. Miscommunication between pharmacy and patient. I Called her pharmacy. Patient will pick up the prescription today  Plan: Hypertension treatment:  continue current medications. Continue Norvasc, Toprol 50 mg daily, quinapril and HCTZ. Repeat blood pressure check visit in 2 weeks after she starts her Norvasc back.

## 2012-05-22 NOTE — Assessment & Plan Note (Signed)
Patient got TdAP vaccination today. She refused flu vaccination. She is up-to-date with her mammogram. - Discussed about Pap smear- she said she will get it done during next visit not today. We'll schedule her in January with me. - Colonoscopy scheduled with Dr. Juanda Chance with [facility] on 07/09/2012.

## 2012-05-22 NOTE — Assessment & Plan Note (Signed)
Stable; COPD

## 2012-05-29 ENCOUNTER — Encounter: Payer: Self-pay | Admitting: *Deleted

## 2012-05-31 ENCOUNTER — Encounter: Payer: Self-pay | Admitting: Internal Medicine

## 2012-05-31 ENCOUNTER — Ambulatory Visit (INDEPENDENT_AMBULATORY_CARE_PROVIDER_SITE_OTHER): Payer: No Typology Code available for payment source | Admitting: Internal Medicine

## 2012-05-31 VITALS — BP 162/99 | HR 60 | Temp 97.8°F | Ht 68.0 in | Wt 137.9 lb

## 2012-05-31 DIAGNOSIS — I10 Essential (primary) hypertension: Secondary | ICD-10-CM

## 2012-05-31 MED ORDER — QUINAPRIL HCL 40 MG PO TABS: 60.0000 mg | ORAL_TABLET | Freq: Every day | ORAL | Status: DC

## 2012-05-31 NOTE — Assessment & Plan Note (Signed)
BP Readings from Last 3 Encounters:  05/31/12 162/99  05/22/12 207/131  02/21/12 143/98    Sodium  Date Value Range Status  10/31/2011 138  135 - 145 mEq/L Final     Potassium  Date Value Range Status  10/31/2011 3.9  3.5 - 5.1 mEq/L Final     Creatinine, Ser  Date Value Range Status  10/31/2011 0.95  0.50 - 1.10 mg/dL Final    Assessment:  Blood pressure control: moderately elevated  Progress toward BP goal:  improved  Comments:   Plan:  Medications:  continue current medications  Educational resources provided:    Self management tools provided:    Other plans: will increase quinapril from 40 mg qd (20 mg bid) to 60 mg qd, pt taking as bid but since we are of the initiation phase can convert to qd dosing.; will change prescription to 40 mg tabs take 1 1/2 pill qd, pt gets from health department; will finish current bottle of 10 mg pills (3 pills bid or 6 pills qd)

## 2012-05-31 NOTE — Patient Instructions (Addendum)
General Instructions: Your blood pressure is doing much better today.  As we discussed, the dosage of quinapril will be increased to 60 mg a day.   We will call in a prescription to the health department for the 40 mg pills. You will therefore take 1 and a half pill daily. Return to the clinic for blood pressure check in 1-2 weeks.   Treatment Goals:  Goals (1 Years of Data) as of 05/31/2012    None      Progress Toward Treatment Goals:  Treatment Goal 05/31/2012  Blood pressure improved    Self Care Goals & Plans:  Self Care Goal 05/31/2012  Manage my medications bring my medications to every visit  Eat healthy foods eat fruit for snacks and desserts  Be physically active find a convenient safe place to exercise       Care Management & Community Referrals:

## 2012-05-31 NOTE — Progress Notes (Signed)
  Subjective:    Patient ID: Margaret Mcmillan, female    DOB: 05/10/1956, 56 y.o.   MRN: [id number]  HPI  Bp recheck since resuming Norvasc after not taking for a couple of weeks. Blood pressure a week ago was 207/131 with pulse 53 bpm. Today patient reports that she's been compliant with amlodipine 10 mg, hydrochlorothiazide 25 mg, and quinapril 40 mg daily. Of note although it still appears on her medication list, pt reports metoprolol 100 mg daily was discontinued secondary to heart rate in the 50s. Today her blood pressure is 162/99 with pulse 60 bpm. Reports feeling "perfect". Without complaints specifically no headaches, chest pain, shortness of breath, lower extremity swelling.  Review of Systems  Constitutional: Negative.   HENT: Negative.   Respiratory: Negative for cough and shortness of breath.   Cardiovascular: Negative for chest pain and leg swelling.  Neurological: Negative for light-headedness and headaches.       Objective:   Physical Exam  Constitutional: She is oriented to person, place, and time. She appears well-developed and well-nourished. No distress.  HENT:  Head: Normocephalic and atraumatic.  Eyes: Conjunctivae normal and EOM are normal. Pupils are equal, round, and reactive to light.  Neck: Normal range of motion. Neck supple.  Cardiovascular: Normal rate, regular rhythm, normal heart sounds and intact distal pulses.   Pulmonary/Chest: Effort normal and breath sounds normal.  Abdominal: Soft. Bowel sounds are normal. There is no tenderness.  Musculoskeletal: Normal range of motion. She exhibits no edema.  Neurological: She is alert and oriented to person, place, and time.  Skin: Skin is warm.  Psychiatric: She has a normal mood and affect.          Assessment & Plan:  1. Htn: bp elevated but improving towards goal less than 144/90 -Will increase her ACE inhibitor today to quinapril 60 mg daily -Patient called into the health department -BP recheck  1-2 weeks

## 2012-06-07 ENCOUNTER — Ambulatory Visit (INDEPENDENT_AMBULATORY_CARE_PROVIDER_SITE_OTHER): Payer: No Typology Code available for payment source | Admitting: Internal Medicine

## 2012-06-07 ENCOUNTER — Encounter: Payer: Self-pay | Admitting: Internal Medicine

## 2012-06-07 VITALS — BP 143/90 | HR 55 | Temp 97.7°F | Ht 67.0 in | Wt 138.0 lb

## 2012-06-07 DIAGNOSIS — F17200 Nicotine dependence, unspecified, uncomplicated: Secondary | ICD-10-CM

## 2012-06-07 DIAGNOSIS — I10 Essential (primary) hypertension: Secondary | ICD-10-CM

## 2012-06-07 MED ORDER — QUINAPRIL HCL 40 MG PO TABS: 40.0000 mg | ORAL_TABLET | Freq: Two times a day (BID) | ORAL | Status: AC

## 2012-06-07 NOTE — Progress Notes (Signed)
  Subjective:    Patient ID: Margaret Mcmillan, female    DOB: 04/26/1956, 56 y.o.   MRN: [id number]  HPI  Pt presents for f/u of bp.  Hx significant for hypertension with prior accelerated hypertension  (200s/100s).  Now on amlodipine 10 mg, HCTZ 25 mg, metoprolol 50 mg bid and recently titrated to quinapril 60 mg qd 1 week ago.  Now with bp 143/90 pulse 55. Denies chest pain, sob, palpitations, headaches or leg swelling. States that she feels "great" and without complaints.  Review of Systems As per HPI otherwise negative.    Objective:   Physical Exam  Constitutional: She is oriented to person, place, and time. She appears well-developed and well-nourished. No distress.  Cardiovascular: Normal rate, regular rhythm, normal heart sounds and intact distal pulses.   Pulmonary/Chest: Effort normal and breath sounds normal.  Abdominal: Soft. Bowel sounds are normal.  Neurological: She is alert and oriented to person, place, and time.  Skin: Skin is dry.  Psychiatric: She has a normal mood and affect.          Assessment & Plan:  1. Htn: closer to goal on max dose calcium channel blocker amlodipine, max HCTZ, and current dose of beta-blocker limited by bradycardia in 50s bpm with ACEi. -will increase quinapril to 40 mg bid and recheck in 1 week.  2. Tobacco Abuse: cont to refrain from smoking thus far, encouraged and motivated -1800-QUIT-NOW resource given again for support

## 2012-06-07 NOTE — Assessment & Plan Note (Signed)
BP Readings from Last 3 Encounters:  06/07/12 143/90  05/31/12 162/99  05/22/12 207/131    Lab Results  Component Value Date   NA 138 10/31/2011   K 3.9 10/31/2011   CREATININE 0.95 10/31/2011    Assessment:  Blood pressure control: mildly elevated  Progress toward BP goal:  improved  Comments: after recent increase to 60 mg qd  Quinapril over past 1 week  Plan:  Medications:  continue current medications  Educational resources provided: brochure  Self management tools provided: home blood pressure logbook  Other plans: will increase to 40 mg bid of quinapril for total daily dosage of 80 mg, continue amlodipine 10 mg, metoprolol 100 mg, and HCTZ 25 mg daily

## 2012-06-07 NOTE — Assessment & Plan Note (Signed)
  Assessment:  Progress toward smoking cessation:     Barriers to progress toward smoking cessation:     Comments: motivated  Plan:  Instruction/counseling given:  I advised patient to stop smoking.  Educational resources provided:     Self management tools provided:     Medications to assist with smoking cessation:  None Patient agreed to the following self-care plans for smoking cessation:      Other: 1800QUIT-NOW

## 2012-06-07 NOTE — Patient Instructions (Addendum)
General Instructions:  Your blood pressure is improving and getting closer to our goal of at least <140/80. You should increase the quinapril dose to 40 mg twice a day (4 of the 10 mg pills). Return to clinic in another week.   Hopefully, you will be at our blood pressure goal and we will then change the quinapril prescription to the 40 mg pills.  Treatment Goals:  Goals (1 Years of Data) as of 06/07/2012          As of Today 05/31/12 05/22/12 02/21/12 01/08/12     Blood Pressure    . Blood Pressure < 140/90  143/90 162/99 207/131 143/98 132/86      Progress Toward Treatment Goals:  Treatment Goal 06/07/2012  Blood pressure improved    Self Care Goals & Plans:  Self Care Goal 06/07/2012  Manage my medications take my medicines as prescribed; bring my medications to every visit; refill my medications on time  Eat healthy foods drink diet soda or water instead of juice or soda; eat more vegetables  Be physically active find an activity I enjoy       Care Management & Community Referrals:  Follow-up in 1 week for a blood pressure re-check.  Continue to refrain from smoking.  You can call the 1800-QUIT-NOW line for assistance.

## 2012-06-14 ENCOUNTER — Encounter: Payer: Self-pay | Admitting: Internal Medicine

## 2012-06-14 ENCOUNTER — Ambulatory Visit (INDEPENDENT_AMBULATORY_CARE_PROVIDER_SITE_OTHER): Payer: No Typology Code available for payment source | Admitting: Internal Medicine

## 2012-06-14 VITALS — BP 156/103 | HR 49 | Temp 99.5°F | Ht 67.0 in | Wt 137.6 lb

## 2012-06-14 DIAGNOSIS — I10 Essential (primary) hypertension: Secondary | ICD-10-CM

## 2012-06-14 NOTE — Patient Instructions (Addendum)
General Instructions: Continue to take your medications as prescribed. HCTZ 25 mg daily. Quinapril 40 mg twice a day. Amlodipine 10 mg daily Metoprolol 50 mg daily. Continue to not smoke. Follow-up in another 2 weeks.   Treatment Goals:  Goals (1 Years of Data) as of 06/14/2012          As of Today As of Today 06/07/12 05/31/12 05/22/12     Blood Pressure    . Blood Pressure < 140/90  156/103 146/99 143/90 162/99 207/131      Progress Toward Treatment Goals:  Treatment Goal 06/14/2012  Blood pressure unchanged    Self Care Goals & Plans:  Self Care Goal 06/14/2012  Manage my medications take my medicines as prescribed; bring my medications to every visit; refill my medications on time  Eat healthy foods drink diet soda or water instead of juice or soda; eat more vegetables; eat foods that are low in salt  Be physically active -       Care Management & Community Referrals:

## 2012-06-14 NOTE — Progress Notes (Signed)
  Subjective:    Patient ID: Margaret Mcmillan, female    DOB: 02-02-1956, 56 y.o.   MRN: [id number]  HPI Presents for followup of hypertension. Blood pressure medicines titrated over the past several weeks. She's currently on amlodipine 10 mg daily, hydrochlorothiazide 25 mg daily, quinapril 80 mg daily and metoprolol 50 mg daily (no further titration secondary to bradycardia).   Review of Systems  Constitutional: Negative.   Respiratory: Negative for shortness of breath.   Cardiovascular: Negative for chest pain, palpitations and leg swelling.  Neurological: Negative.   Psychiatric/Behavioral: Negative.        Objective:   Physical Exam  Constitutional: She is oriented to person, place, and time. She appears well-developed and well-nourished. No distress.  Cardiovascular: Normal rate, regular rhythm and normal heart sounds.   Pulmonary/Chest: Effort normal and breath sounds normal.  Musculoskeletal: Normal range of motion. She exhibits no edema.  Neurological: She is alert and oriented to person, place, and time.  Psychiatric: She has a normal mood and affect.          Assessment & Plan:  #1 hypertension: Close to goal with BP today 143/90 pulse 55 bpm on max dose of calcium channel blocker,  Thiazide diuretic, ACE inhibitor, and 50 mg daily of metoprolol limited by bradycardia; current goal 140/90 -Will defer further adjustment today and reassess in approximately 2 weeks -On follow-up further adjustment needed could consider change from metoprolol to labetalol or change from hydrochlorothiazide to chlorthalidone (this is for follow plan was approximately $17 at [facility] 4 months supply)

## 2012-06-14 NOTE — Assessment & Plan Note (Signed)
BP Readings from Last 3 Encounters:  06/14/12 156/103  06/07/12 143/90  05/31/12 162/99    Lab Results  Component Value Date   NA 138 10/31/2011   K 3.9 10/31/2011   CREATININE 0.95 10/31/2011    Assessment:  Blood pressure control: mildly elevated  Progress toward BP goal:  unchanged  Comments:   Plan:  Medications:  continue current medications  Educational resources provided: brochure  Self management tools provided: home blood pressure logbook  Other plans: will continue with HCTZ 25mg  qd, quinipril 80 mg qd, amlodipine 10 mg qd, and metoprolol 50 mg qd and re-assess in another 2 weeks.  Pt is currently on max doses of all agents except BB which is limited by bradycardia

## 2012-06-28 ENCOUNTER — Ambulatory Visit: Payer: Self-pay | Admitting: Internal Medicine

## 2012-07-01 ENCOUNTER — Ambulatory Visit: Payer: Self-pay

## 2012-07-09 ENCOUNTER — Encounter: Payer: Self-pay | Admitting: Internal Medicine

## 2012-07-09 NOTE — Addendum Note (Signed)
Addended by: Neomia Dear on: 07/09/2012 05:03 PM   Modules accepted: Orders

## 2012-07-10 ENCOUNTER — Ambulatory Visit: Payer: Self-pay | Admitting: Adult Health

## 2012-07-17 ENCOUNTER — Encounter: Payer: Self-pay | Admitting: Internal Medicine

## 2012-07-19 ENCOUNTER — Ambulatory Visit: Payer: Self-pay | Admitting: Adult Health

## 2012-07-24 ENCOUNTER — Other Ambulatory Visit: Payer: Self-pay | Admitting: *Deleted

## 2012-07-24 MED ORDER — ATORVASTATIN CALCIUM 40 MG PO TABS: 40.0000 mg | ORAL_TABLET | Freq: Every day | ORAL | Status: AC

## 2012-07-24 NOTE — Telephone Encounter (Signed)
[facility] informed of Lipitor refill.

## 2012-12-03 IMAGING — CR DG CHEST 1V PORT
1 series · 1 of 1 positions shown · non-contrast
Comparison: 1155 hours the same day earlier.

CLINICAL DATA: 55-year-old female status post clamped chest tube.

PORTABLE CHEST - 1 VIEW

[AP]
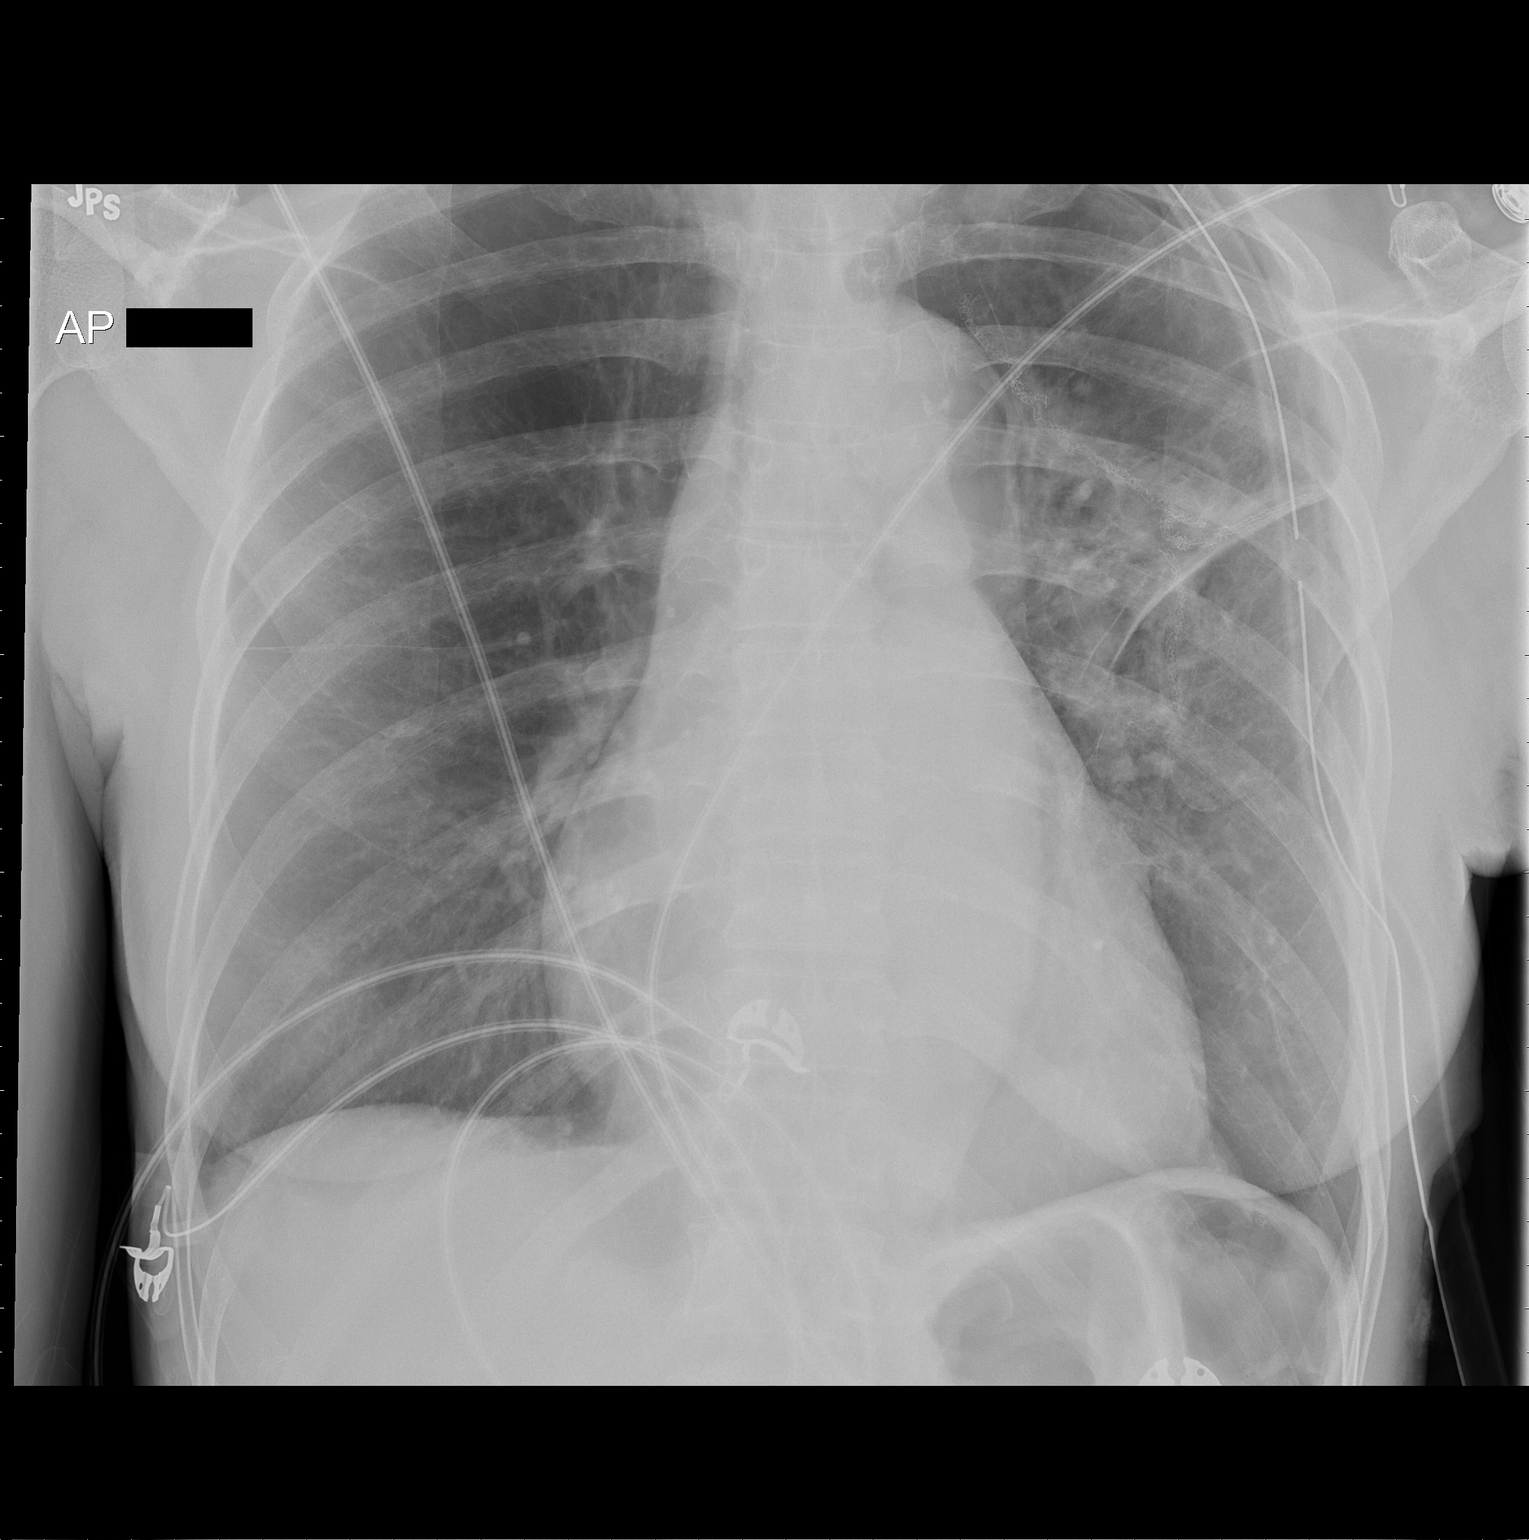

[1 of 1 positions shown; findings below may reference images not displayed]

FINDINGS: AP seated upright portable view 1460 hours.  Stable left
side pneumothorax, measuring about 15 mm from the level of the
second rib to the pleural edge in the apex.  Stable left chest tube
and postoperative changes to the left lung.

 Stable cardiac size and mediastinal contours.  The right lung
remains grossly clear.
IMPRESSION: Stable left pneumothorax.  Left chest tube remains in place.

## 2012-12-04 IMAGING — CR DG CHEST 1V PORT
1 series · 1 of 1 positions shown · non-contrast
Comparison: Chest radiograph 10/30/2011

CLINICAL DATA: Status post wedge resection, pneumothorax

PORTABLE CHEST - 1 VIEW

[view not recorded]
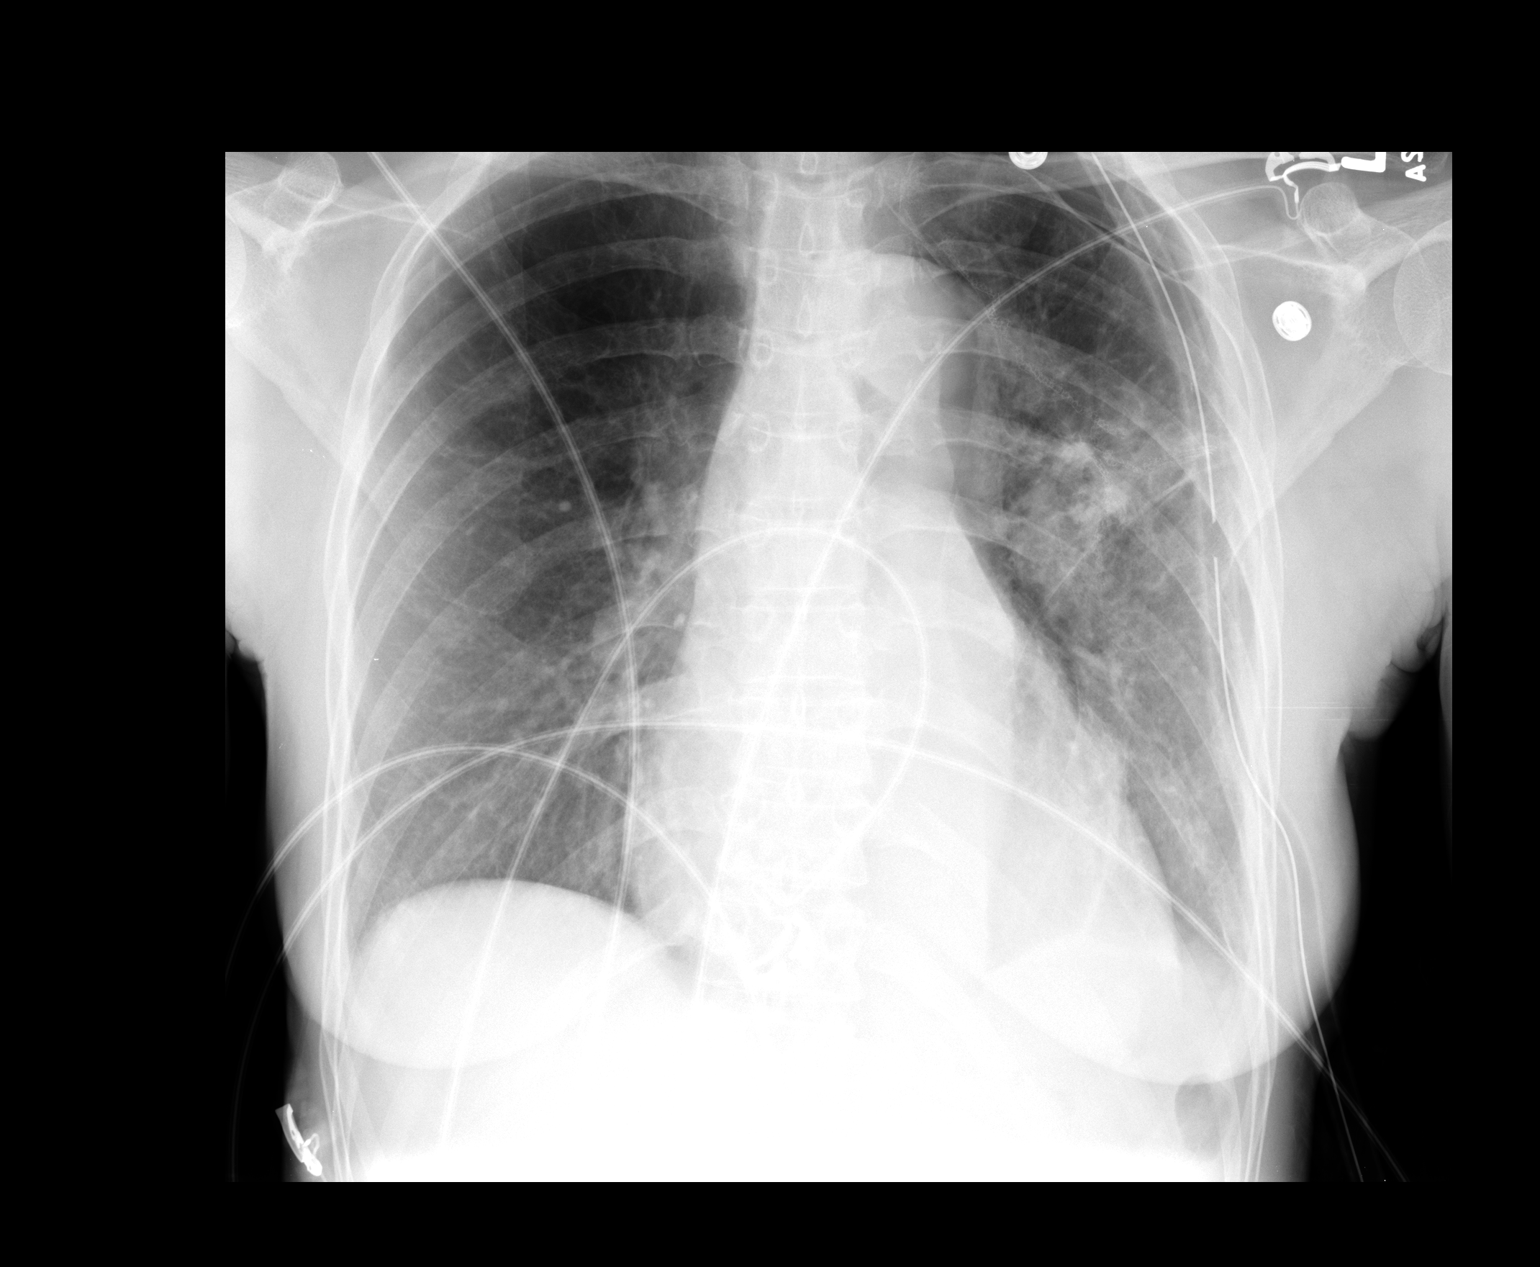

[1 of 1 positions shown; findings below may reference images not displayed]

FINDINGS: There is a left apical pneumothorax with chest tube in
place.  The pleural edge measures 24 mm from the apical chest wall
compared to 15 mm on prior.  Visually the pneumothorax appears
slightly larger. There is a pleural edge along the left
mediastinum.  Volume loss in the left hemithorax consistent with
surgery.   Slight increase in left lower lobe atelectasis.  Right
lung is clear.
IMPRESSION: 1.. Minimal increase in volume of left apical and anterior
pneumothorax with chest tube in place.
2.  Slight increase in left lower lobe atelectasis.

## 2012-12-04 IMAGING — CR DG CHEST 1V PORT
1 series · 1 of 1 positions shown · non-contrast
Comparison: 10/31/2011

CLINICAL DATA: Follow up left pneumothorax after chest tube
removal.

PORTABLE CHEST - 1 VIEW

[view not recorded]
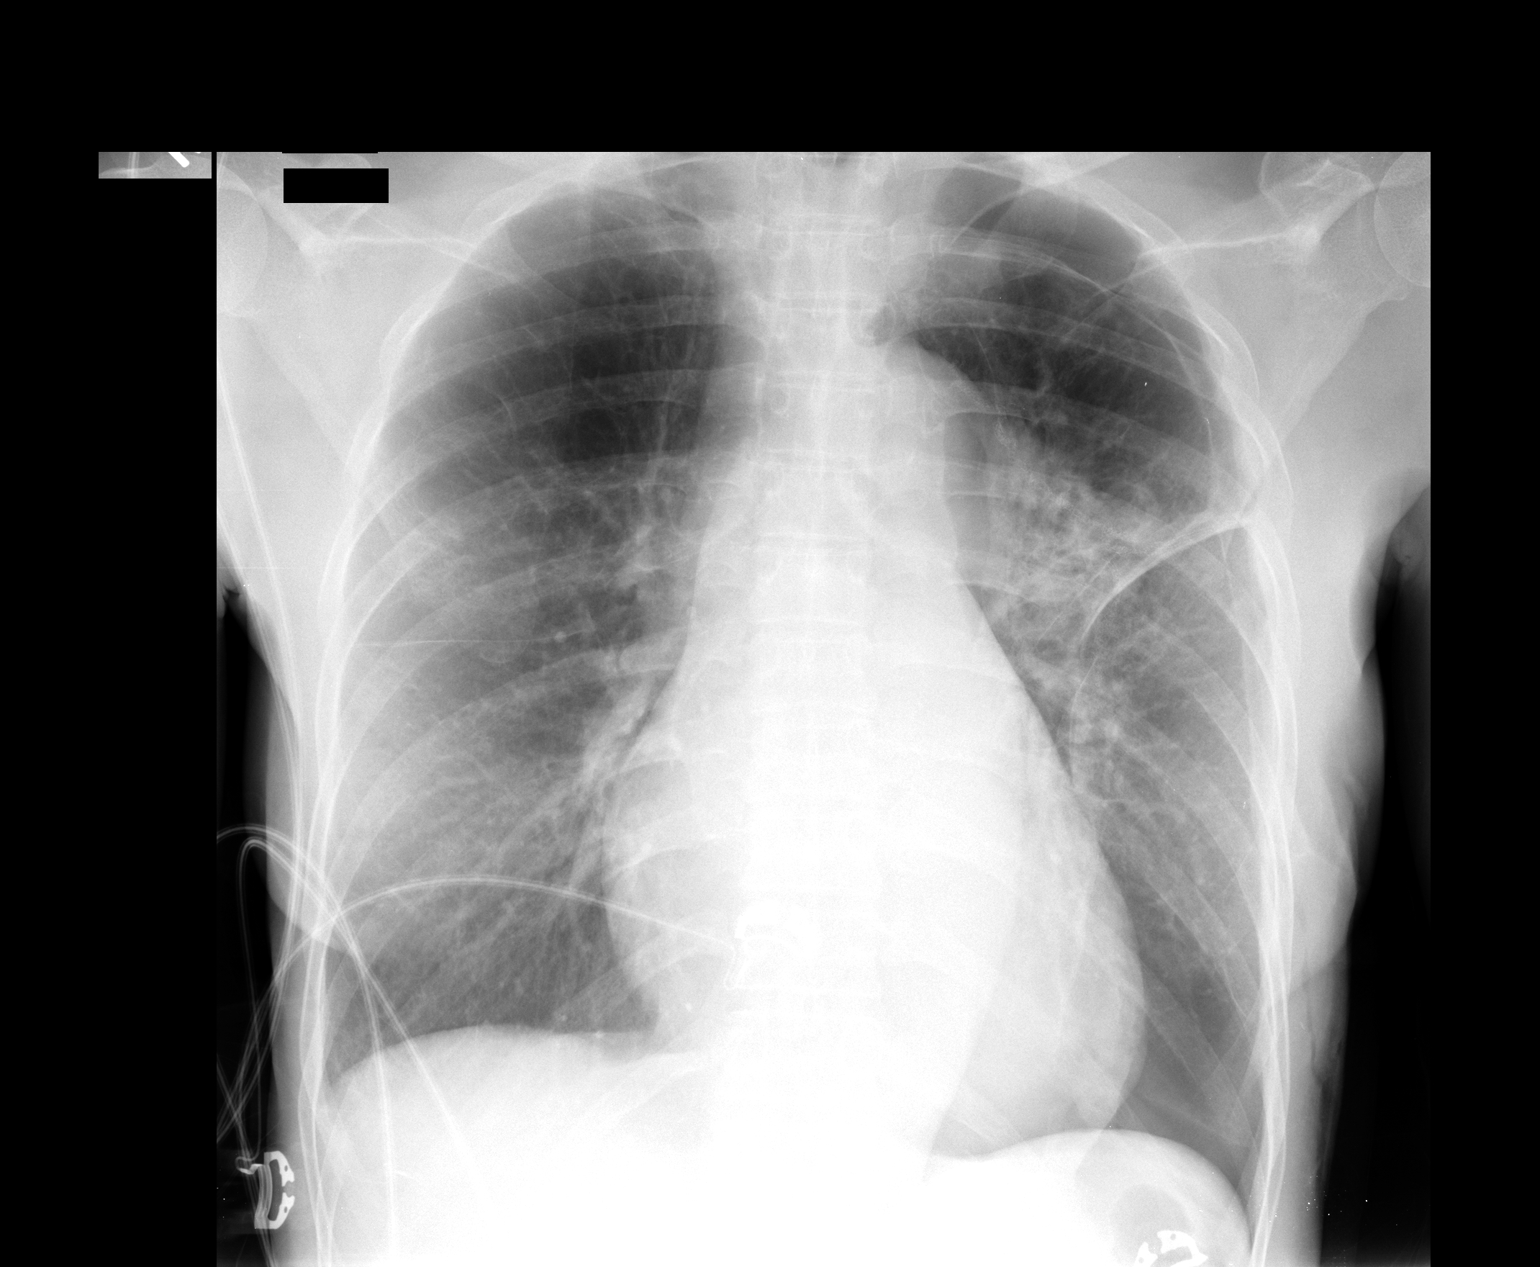

[1 of 1 positions shown; findings below may reference images not displayed]

FINDINGS: There is mild cardiac enlargement.

No pleural effusion identified.

The left-sided chest tube has been removed.  Again seen is a left-
sided pneumothorax.  The apical component is unchanged in volume.
However, there is a new basilar component which measures
approximately 1.9 cm in thickness.

Left upper lobe airspace consolidation is identified which
unchanged from previous study.
IMPRESSION: 1.  New apical component to the left sided pneumothorax since chest
tube removal.
2.  No change in left upper lobe airspace consolidation.

## 2012-12-05 IMAGING — CR DG CHEST 2V
2 series · 2 of 2 positions shown · non-contrast
Comparison: 10/31/2011

CLINICAL DATA: Left pneumothorax, chest tube removal.

CHEST - 2 VIEW

[w chest pa]
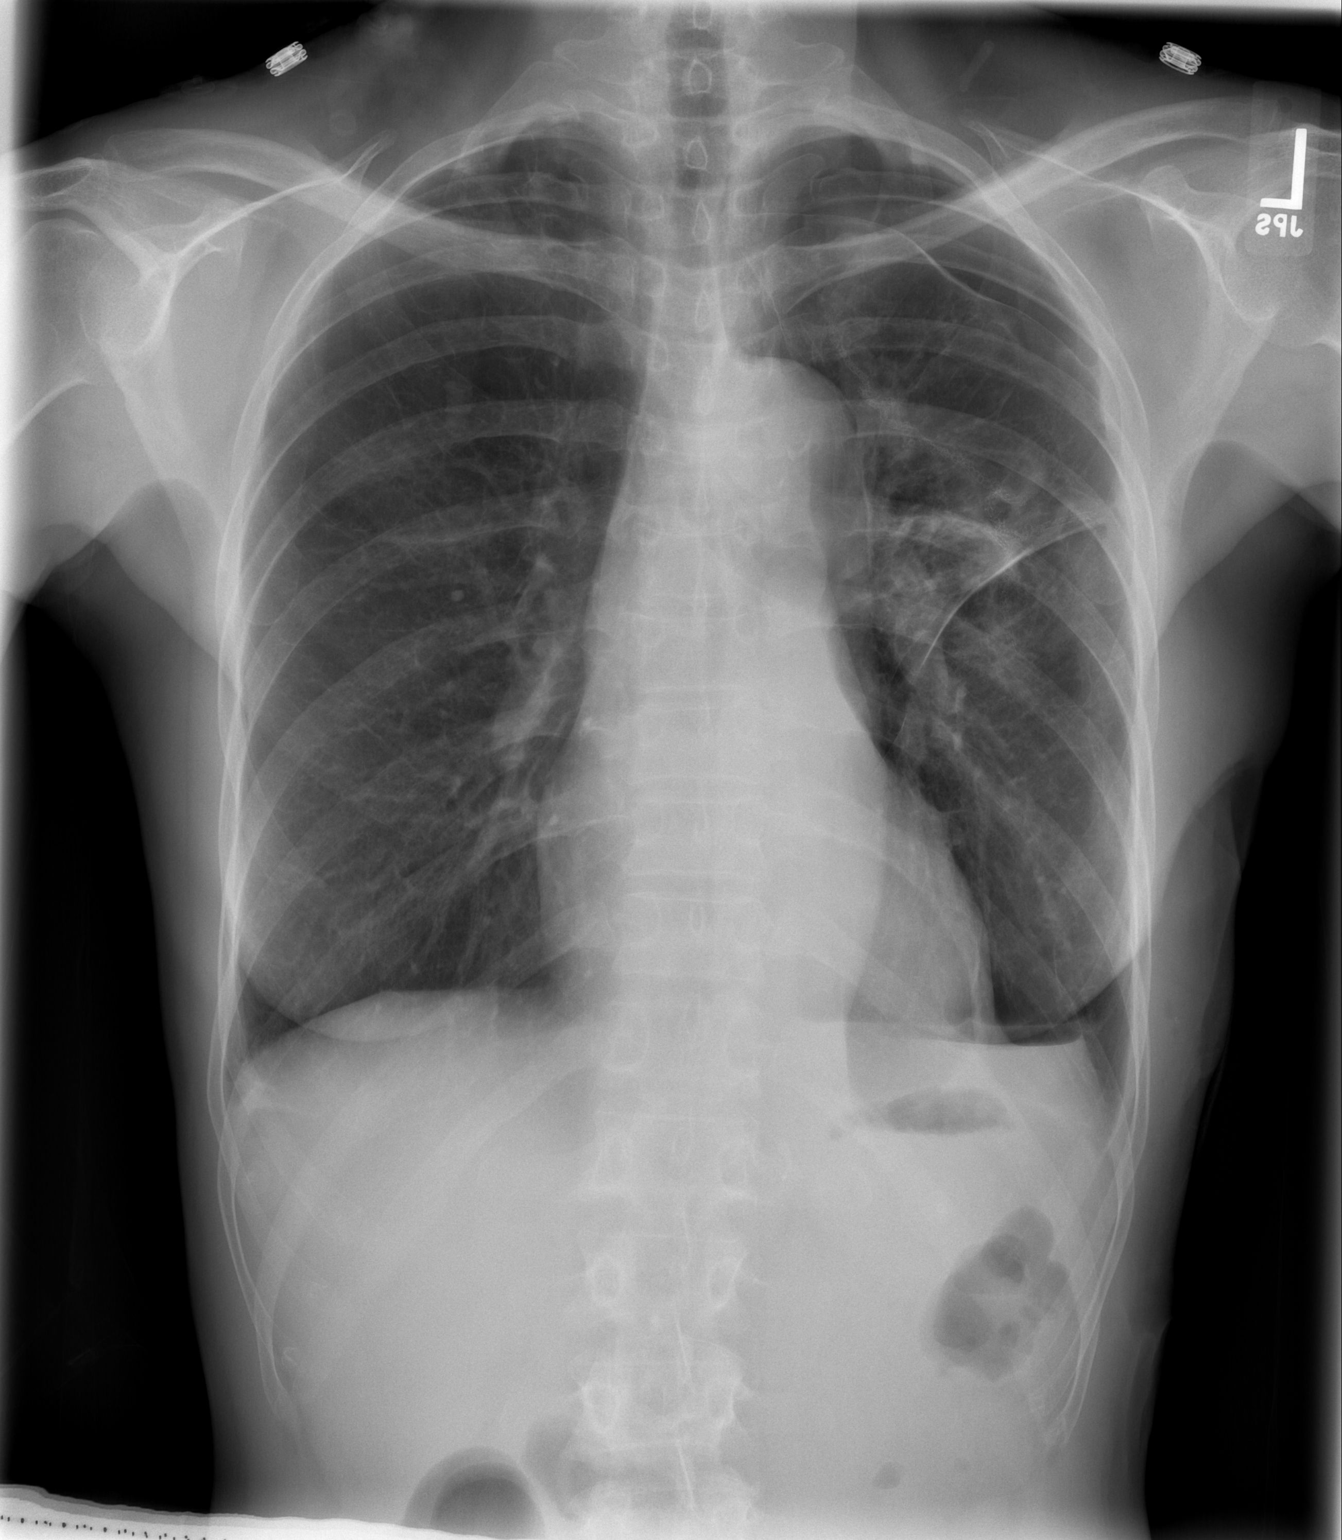

[w chest lat]
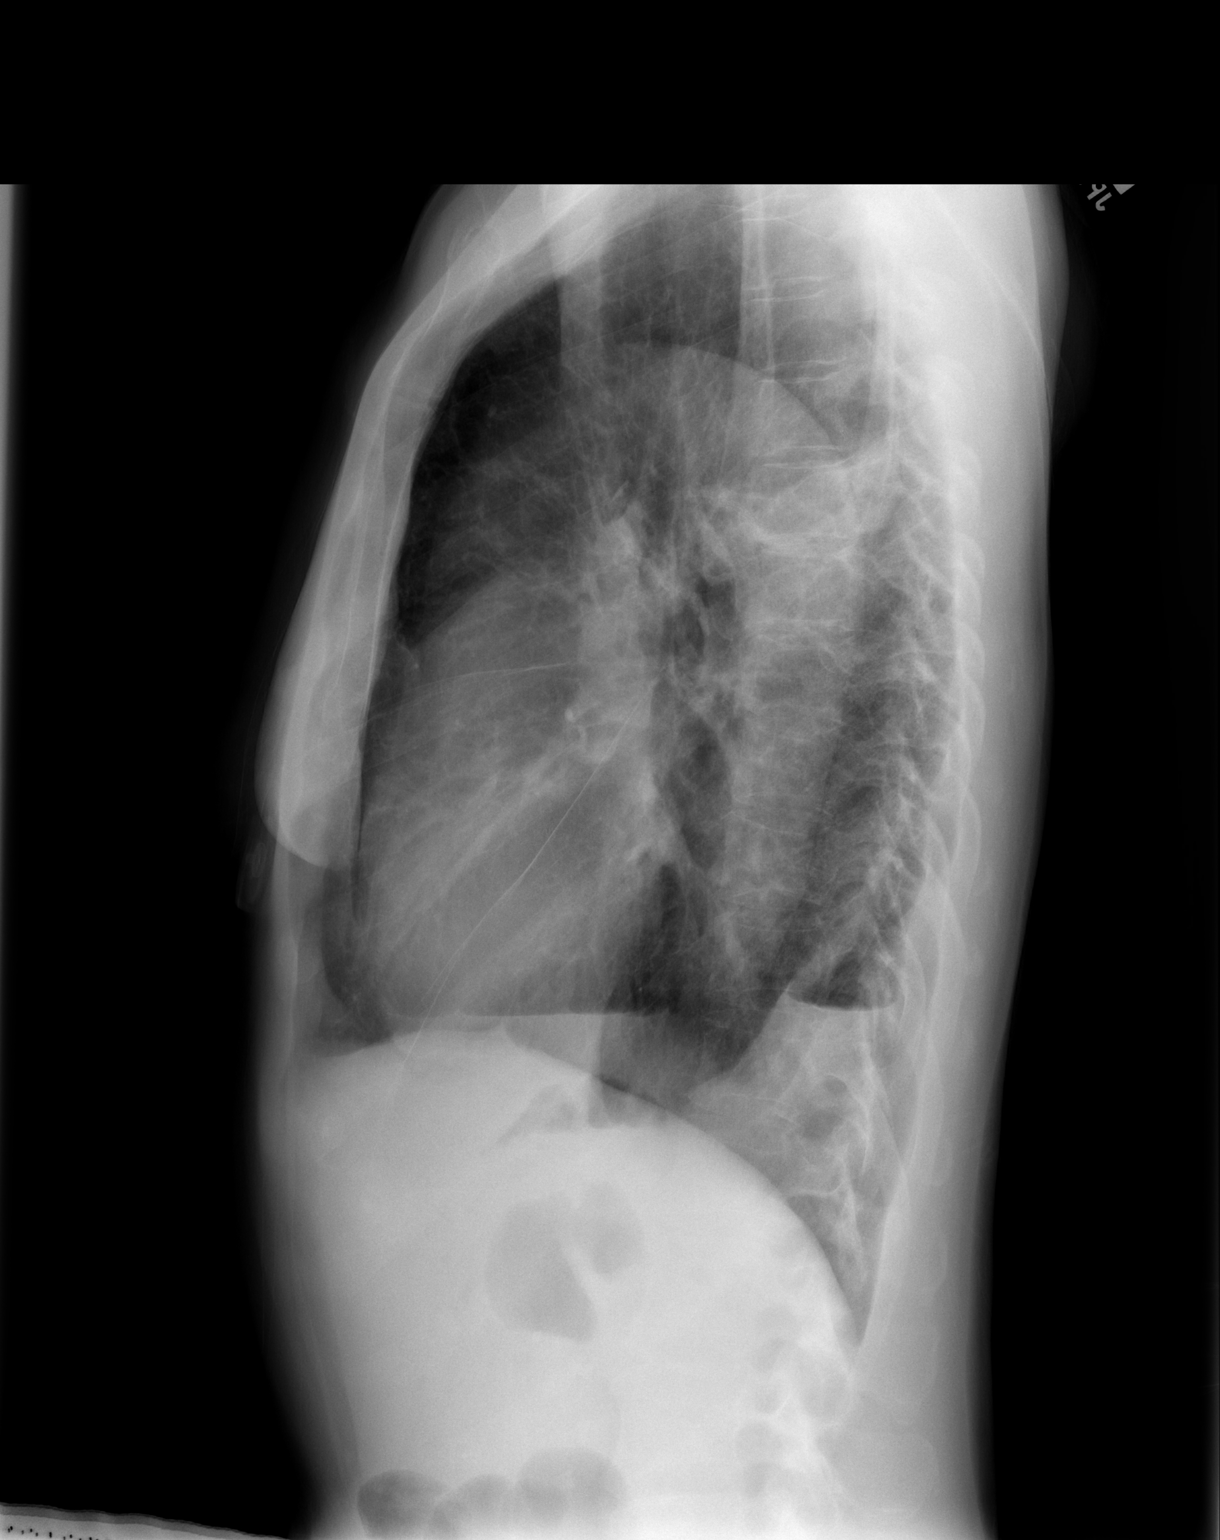

[2 of 2 positions shown; findings below may reference images not displayed]

FINDINGS: The left hydropneumothorax noted.  The size of the
pneumothorax is stable.  Heart is normal size.  Right lung is
clear.  Areas of atelectasis in the left lung.
IMPRESSION: Left hydropneumothorax with stable size of the pneumothorax.

## 2013-02-26 ENCOUNTER — Encounter: Payer: Self-pay | Admitting: Internal Medicine

## 2013-08-07 ENCOUNTER — Ambulatory Visit: Payer: Self-pay | Admitting: Internal Medicine

## 2014-09-26 ENCOUNTER — Inpatient Hospital Stay (HOSPITAL_COMMUNITY): Payer: Medicaid Other

## 2014-09-26 ENCOUNTER — Other Ambulatory Visit (HOSPITAL_COMMUNITY): Payer: Self-pay

## 2014-09-26 ENCOUNTER — Inpatient Hospital Stay (HOSPITAL_COMMUNITY)
Admission: EM | Admit: 2014-09-26 | Discharge: 2014-10-25 | DRG: 023 | Disposition: E | Payer: Medicaid Other | Attending: Neurology | Admitting: Neurology

## 2014-09-26 ENCOUNTER — Emergency Department (HOSPITAL_COMMUNITY): Payer: Medicaid Other

## 2014-09-26 DIAGNOSIS — Z833 Family history of diabetes mellitus: Secondary | ICD-10-CM | POA: Diagnosis not present

## 2014-09-26 DIAGNOSIS — J449 Chronic obstructive pulmonary disease, unspecified: Secondary | ICD-10-CM | POA: Diagnosis present

## 2014-09-26 DIAGNOSIS — R0602 Shortness of breath: Secondary | ICD-10-CM

## 2014-09-26 DIAGNOSIS — Z681 Body mass index (BMI) 19 or less, adult: Secondary | ICD-10-CM | POA: Diagnosis not present

## 2014-09-26 DIAGNOSIS — I499 Cardiac arrhythmia, unspecified: Secondary | ICD-10-CM | POA: Diagnosis present

## 2014-09-26 DIAGNOSIS — E870 Hyperosmolality and hypernatremia: Secondary | ICD-10-CM | POA: Diagnosis present

## 2014-09-26 DIAGNOSIS — I610 Nontraumatic intracerebral hemorrhage in hemisphere, subcortical: Principal | ICD-10-CM | POA: Diagnosis present

## 2014-09-26 DIAGNOSIS — F1410 Cocaine abuse, uncomplicated: Secondary | ICD-10-CM | POA: Diagnosis present

## 2014-09-26 DIAGNOSIS — R29810 Facial weakness: Secondary | ICD-10-CM | POA: Diagnosis present

## 2014-09-26 DIAGNOSIS — G936 Cerebral edema: Secondary | ICD-10-CM | POA: Diagnosis present

## 2014-09-26 DIAGNOSIS — Z0189 Encounter for other specified special examinations: Secondary | ICD-10-CM

## 2014-09-26 DIAGNOSIS — F1290 Cannabis use, unspecified, uncomplicated: Secondary | ICD-10-CM | POA: Diagnosis present

## 2014-09-26 DIAGNOSIS — Z87891 Personal history of nicotine dependence: Secondary | ICD-10-CM | POA: Diagnosis not present

## 2014-09-26 DIAGNOSIS — E873 Alkalosis: Secondary | ICD-10-CM | POA: Diagnosis present

## 2014-09-26 DIAGNOSIS — R4701 Aphasia: Secondary | ICD-10-CM | POA: Diagnosis present

## 2014-09-26 DIAGNOSIS — E876 Hypokalemia: Secondary | ICD-10-CM | POA: Diagnosis present

## 2014-09-26 DIAGNOSIS — Z79899 Other long term (current) drug therapy: Secondary | ICD-10-CM

## 2014-09-26 DIAGNOSIS — I619 Nontraumatic intracerebral hemorrhage, unspecified: Secondary | ICD-10-CM | POA: Diagnosis present

## 2014-09-26 DIAGNOSIS — Z66 Do not resuscitate: Secondary | ICD-10-CM | POA: Diagnosis present

## 2014-09-26 DIAGNOSIS — G935 Compression of brain: Secondary | ICD-10-CM | POA: Diagnosis present

## 2014-09-26 DIAGNOSIS — E785 Hyperlipidemia, unspecified: Secondary | ICD-10-CM | POA: Diagnosis present

## 2014-09-26 DIAGNOSIS — J9601 Acute respiratory failure with hypoxia: Secondary | ICD-10-CM

## 2014-09-26 DIAGNOSIS — Z515 Encounter for palliative care: Secondary | ICD-10-CM

## 2014-09-26 DIAGNOSIS — G8191 Hemiplegia, unspecified affecting right dominant side: Secondary | ICD-10-CM | POA: Diagnosis present

## 2014-09-26 DIAGNOSIS — I10 Essential (primary) hypertension: Secondary | ICD-10-CM | POA: Diagnosis present

## 2014-09-26 DIAGNOSIS — J9600 Acute respiratory failure, unspecified whether with hypoxia or hypercapnia: Secondary | ICD-10-CM

## 2014-09-26 DIAGNOSIS — R4020 Unspecified coma: Secondary | ICD-10-CM | POA: Diagnosis present

## 2014-09-26 DIAGNOSIS — Z9114 Patient's other noncompliance with medication regimen: Secondary | ICD-10-CM | POA: Diagnosis present

## 2014-09-26 DIAGNOSIS — G919 Hydrocephalus, unspecified: Secondary | ICD-10-CM | POA: Diagnosis present

## 2014-09-26 DIAGNOSIS — R531 Weakness: Secondary | ICD-10-CM | POA: Diagnosis present

## 2014-09-26 DIAGNOSIS — Z8249 Family history of ischemic heart disease and other diseases of the circulatory system: Secondary | ICD-10-CM

## 2014-09-26 DIAGNOSIS — J9690 Respiratory failure, unspecified, unspecified whether with hypoxia or hypercapnia: Secondary | ICD-10-CM

## 2014-09-26 LAB — COMPREHENSIVE METABOLIC PANEL
ALK PHOS: 62 U/L (ref 39–117)
ALT: 10 U/L (ref 0–35)
AST: 25 U/L (ref 0–37)
Albumin: 3.7 g/dL (ref 3.5–5.2)
Anion gap: 6 (ref 5–15)
BUN: 14 mg/dL (ref 6–23)
CHLORIDE: 107 mmol/L (ref 96–112)
CO2: 26 mmol/L (ref 19–32)
Calcium: 8.7 mg/dL (ref 8.4–10.5)
Creatinine, Ser: 1.13 mg/dL — ABNORMAL HIGH (ref 0.50–1.10)
GFR calc non Af Amer: 53 mL/min — ABNORMAL LOW (ref >90)
GFR, EST AFRICAN AMERICAN: 61 mL/min — AB (ref >90)
Glucose, Bld: 130 mg/dL — ABNORMAL HIGH (ref 70–99)
POTASSIUM: 3.3 mmol/L — AB (ref 3.5–5.1)
SODIUM: 139 mmol/L (ref 135–145)
Total Bilirubin: 0.5 mg/dL (ref 0.3–1.2)
Total Protein: 6.6 g/dL (ref 6.0–8.3)

## 2014-09-26 LAB — URINALYSIS, ROUTINE W REFLEX MICROSCOPIC
Bilirubin Urine: NEGATIVE
GLUCOSE, UA: NEGATIVE mg/dL
Hgb urine dipstick: NEGATIVE
KETONES UR: NEGATIVE mg/dL
LEUKOCYTES UA: NEGATIVE
Nitrite: NEGATIVE
PROTEIN: 30 mg/dL — AB
Specific Gravity, Urine: 1.008 (ref 1.005–1.030)
Urobilinogen, UA: 0.2 mg/dL (ref 0.0–1.0)
pH: 7.5 (ref 5.0–8.0)

## 2014-09-26 LAB — SODIUM
SODIUM: 139 mmol/L (ref 135–145)
SODIUM: 150 mmol/L — AB (ref 135–145)

## 2014-09-26 LAB — POCT I-STAT 3, ART BLOOD GAS (G3+)
Acid-base deficit: 3 mmol/L — ABNORMAL HIGH (ref 0.0–2.0)
Bicarbonate: 20.4 mEq/L (ref 20.0–24.0)
O2 Saturation: 100 %
TCO2: 21 mmol/L (ref 0–100)
pCO2 arterial: 28.5 mmHg — ABNORMAL LOW (ref 35.0–45.0)
pH, Arterial: 7.452 — ABNORMAL HIGH (ref 7.350–7.450)
pO2, Arterial: 242 mmHg — ABNORMAL HIGH (ref 80.0–100.0)

## 2014-09-26 LAB — PROTIME-INR
INR: 1 (ref 0.00–1.49)
PROTHROMBIN TIME: 13.3 s (ref 11.6–15.2)

## 2014-09-26 LAB — CBC
HEMATOCRIT: 44.8 % (ref 36.0–46.0)
HEMOGLOBIN: 14.6 g/dL (ref 12.0–15.0)
MCH: 29 pg (ref 26.0–34.0)
MCHC: 32.6 g/dL (ref 30.0–36.0)
MCV: 89.1 fL (ref 78.0–100.0)
Platelets: 163 10*3/uL (ref 150–400)
RBC: 5.03 MIL/uL (ref 3.87–5.11)
RDW: 15.3 % (ref 11.5–15.5)
WBC: 6.6 10*3/uL (ref 4.0–10.5)

## 2014-09-26 LAB — URINE MICROSCOPIC-ADD ON

## 2014-09-26 LAB — MRSA PCR SCREENING: MRSA by PCR: NEGATIVE

## 2014-09-26 LAB — RAPID URINE DRUG SCREEN, HOSP PERFORMED
AMPHETAMINES: NOT DETECTED
Barbiturates: NOT DETECTED
Benzodiazepines: NOT DETECTED
COCAINE: NOT DETECTED
OPIATES: NOT DETECTED
Tetrahydrocannabinol: POSITIVE — AB

## 2014-09-26 LAB — I-STAT TROPONIN, ED: TROPONIN I, POC: 0 ng/mL (ref 0.00–0.08)

## 2014-09-26 LAB — DIFFERENTIAL
BASOS ABS: 0.1 10*3/uL (ref 0.0–0.1)
Basophils Relative: 1 % (ref 0–1)
EOS ABS: 0.1 10*3/uL (ref 0.0–0.7)
EOS PCT: 1 % (ref 0–5)
LYMPHS ABS: 2.7 10*3/uL (ref 0.7–4.0)
LYMPHS PCT: 40 % (ref 12–46)
MONOS PCT: 5 % (ref 3–12)
Monocytes Absolute: 0.3 10*3/uL (ref 0.1–1.0)
Neutro Abs: 3.5 10*3/uL (ref 1.7–7.7)
Neutrophils Relative %: 53 % (ref 43–77)

## 2014-09-26 LAB — I-STAT CHEM 8, ED
BUN: 15 mg/dL (ref 6–23)
Calcium, Ion: 1.14 mmol/L (ref 1.12–1.23)
Chloride: 104 mmol/L (ref 96–112)
Creatinine, Ser: 1 mg/dL (ref 0.50–1.10)
Glucose, Bld: 132 mg/dL — ABNORMAL HIGH (ref 70–99)
HEMATOCRIT: 47 % — AB (ref 36.0–46.0)
Hemoglobin: 16 g/dL — ABNORMAL HIGH (ref 12.0–15.0)
POTASSIUM: 3.1 mmol/L — AB (ref 3.5–5.1)
SODIUM: 143 mmol/L (ref 135–145)
TCO2: 22 mmol/L (ref 0–100)

## 2014-09-26 LAB — APTT: APTT: 31 s (ref 24–37)

## 2014-09-26 LAB — ETHANOL

## 2014-09-26 MED ORDER — ONDANSETRON HCL 4 MG/2ML IJ SOLN
INTRAMUSCULAR | Status: AC
  Filled 2014-09-26: qty 2

## 2014-09-26 MED ORDER — LABETALOL HCL 5 MG/ML IV SOLN
10.0000 mg | Freq: Once | INTRAVENOUS | Status: AC
  Administered 2014-09-26: 10 mg via INTRAVENOUS

## 2014-09-26 MED ORDER — LORAZEPAM 2 MG/ML IJ SOLN: 1.0000 mg | INTRAMUSCULAR | Status: DC | PRN

## 2014-09-26 MED ORDER — ONDANSETRON HCL 4 MG/2ML IJ SOLN
4.0000 mg | Freq: Once | INTRAMUSCULAR | Status: AC
  Administered 2014-09-26: 4 mg via INTRAVENOUS

## 2014-09-26 MED ORDER — NICARDIPINE HCL IN NACL 20-0.86 MG/200ML-% IV SOLN
5.0000 mg/h | Freq: Once | INTRAVENOUS | Status: AC
  Administered 2014-09-26: 5 mg/h via INTRAVENOUS

## 2014-09-26 MED ORDER — NICARDIPINE HCL IN NACL 20-0.86 MG/200ML-% IV SOLN
INTRAVENOUS | Status: AC
  Filled 2014-09-26: qty 200

## 2014-09-26 MED ORDER — SODIUM CHLORIDE 0.9 % IV SOLN
1000.0000 mg | Freq: Once | INTRAVENOUS | Status: AC
  Administered 2014-09-26: 1000 mg via INTRAVENOUS
  Filled 2014-09-26: qty 10

## 2014-09-26 MED ORDER — CETYLPYRIDINIUM CHLORIDE 0.05 % MT LIQD
7.0000 mL | Freq: Four times a day (QID) | OROMUCOSAL | Status: DC
  Administered 2014-09-27 (×2): 7 mL via OROMUCOSAL

## 2014-09-26 MED ORDER — CHLORHEXIDINE GLUCONATE 0.12 % MT SOLN
15.0000 mL | Freq: Two times a day (BID) | OROMUCOSAL | Status: DC
Start: 2014-09-26 — End: 2014-09-27
  Administered 2014-09-26 – 2014-09-27 (×2): 15 mL via OROMUCOSAL
  Filled 2014-09-26 (×2): qty 15

## 2014-09-26 MED ORDER — ROCURONIUM BROMIDE 50 MG/5ML IV SOLN
INTRAVENOUS | Status: AC
  Administered 2014-09-26: 70 mg
  Filled 2014-09-26: qty 2

## 2014-09-26 MED ORDER — ACETAMINOPHEN 650 MG RE SUPP
650.0000 mg | RECTAL | Status: DC | PRN
  Administered 2014-09-27: 650 mg via RECTAL
  Filled 2014-09-26: qty 1

## 2014-09-26 MED ORDER — SENNOSIDES-DOCUSATE SODIUM 8.6-50 MG PO TABS
1.0000 | ORAL_TABLET | Freq: Two times a day (BID) | ORAL | Status: DC
  Filled 2014-09-26 (×4): qty 1

## 2014-09-26 MED ORDER — LORAZEPAM 2 MG/ML IJ SOLN
INTRAMUSCULAR | Status: AC
  Administered 2014-09-26: 2 mg
  Filled 2014-09-26: qty 1

## 2014-09-26 MED ORDER — LORAZEPAM 2 MG/ML IJ SOLN
1.0000 mg | Freq: Once | INTRAMUSCULAR | Status: AC
  Administered 2014-09-26: 1 mg via INTRAVENOUS

## 2014-09-26 MED ORDER — SUCCINYLCHOLINE CHLORIDE 20 MG/ML IJ SOLN
INTRAMUSCULAR | Status: AC
Start: 2014-09-26 — End: 2014-09-26
  Filled 2014-09-26: qty 1

## 2014-09-26 MED ORDER — HYDRALAZINE HCL 20 MG/ML IJ SOLN
INTRAMUSCULAR | Status: AC
  Filled 2014-09-26: qty 1

## 2014-09-26 MED ORDER — STROKE: EARLY STAGES OF RECOVERY BOOK
Freq: Once | Status: DC
  Filled 2014-09-26: qty 1

## 2014-09-26 MED ORDER — ETOMIDATE 2 MG/ML IV SOLN
INTRAVENOUS | Status: AC
  Administered 2014-09-26: 20 mg
  Filled 2014-09-26: qty 20

## 2014-09-26 MED ORDER — FENTANYL CITRATE 0.05 MG/ML IJ SOLN
25.0000 ug | INTRAMUSCULAR | Status: DC | PRN
Start: 2014-09-26 — End: 2014-09-28
  Administered 2014-09-26: 100 ug via INTRAVENOUS
  Filled 2014-09-26: qty 2

## 2014-09-26 MED ORDER — NICARDIPINE HCL IN NACL 20-0.86 MG/200ML-% IV SOLN
3.0000 mg/h | INTRAVENOUS | Status: DC
  Administered 2014-09-26: 10 mg/h via INTRAVENOUS
  Administered 2014-09-26: 4 mg/h via INTRAVENOUS
  Administered 2014-09-27: 5 mg/h via INTRAVENOUS
  Administered 2014-09-27: 3 mg/h via INTRAVENOUS
  Administered 2014-09-27: 7.5 mg/h via INTRAVENOUS
  Filled 2014-09-26 (×6): qty 200

## 2014-09-26 MED ORDER — PROPOFOL 10 MG/ML IV EMUL
5.0000 ug/kg/min | INTRAVENOUS | Status: DC
  Administered 2014-09-26: 10 ug/kg/min via INTRAVENOUS
  Filled 2014-09-26: qty 100

## 2014-09-26 MED ORDER — SODIUM CHLORIDE 0.9 % IV SOLN
500.0000 mg | Freq: Two times a day (BID) | INTRAVENOUS | Status: DC
  Administered 2014-09-27 – 2014-09-28 (×2): 500 mg via INTRAVENOUS
  Filled 2014-09-26 (×4): qty 5

## 2014-09-26 MED ORDER — LORAZEPAM 2 MG/ML IJ SOLN
INTRAMUSCULAR | Status: AC
  Filled 2014-09-26: qty 1

## 2014-09-26 MED ORDER — PANTOPRAZOLE SODIUM 40 MG IV SOLR
40.0000 mg | Freq: Every day | INTRAVENOUS | Status: DC
  Administered 2014-09-26: 40 mg via INTRAVENOUS
  Filled 2014-09-26 (×2): qty 40

## 2014-09-26 MED ORDER — SODIUM CHLORIDE 3 % IV SOLN
INTRAVENOUS | Status: DC
  Administered 2014-09-26: 75 mL/h via INTRAVENOUS
  Administered 2014-09-26: 100 mL/h via INTRAVENOUS
  Administered 2014-09-27: 50 mL/h via INTRAVENOUS
  Filled 2014-09-26 (×5): qty 500

## 2014-09-26 MED ORDER — LIDOCAINE HCL (CARDIAC) 20 MG/ML IV SOLN
INTRAVENOUS | Status: AC
Start: 2014-09-26 — End: 2014-09-26
  Filled 2014-09-26: qty 5

## 2014-09-26 MED ORDER — ACETAMINOPHEN 325 MG PO TABS
650.0000 mg | ORAL_TABLET | ORAL | Status: DC | PRN
  Filled 2014-09-26: qty 2

## 2014-09-26 MED ORDER — SODIUM CHLORIDE 23.4 % INJECTION (4 MEQ/ML) FOR IV ADMINISTRATION
30.0000 mL | Freq: Once | INTRAVENOUS | Status: AC
  Administered 2014-09-26: 30 mL via INTRAVENOUS
  Filled 2014-09-26: qty 30

## 2014-09-26 MED ORDER — ONDANSETRON HCL 4 MG/2ML IJ SOLN
4.0000 mg | Freq: Four times a day (QID) | INTRAMUSCULAR | Status: DC | PRN
  Administered 2014-09-26: 4 mg via INTRAVENOUS
  Filled 2014-09-26: qty 2

## 2014-09-26 MED ORDER — LABETALOL HCL 5 MG/ML IV SOLN
INTRAVENOUS | Status: AC
  Filled 2014-09-26: qty 4

## 2014-09-26 NOTE — Consult Note (Signed)
PULMONARY / CRITICAL CARE MEDICINE   Name: Margaret Mcmillan MRN: [id number] DOB: 14-Jun-1956    ADMISSION DATE:  10/08/2014 CONSULTATION DATE: 4/2  REFERRING MD :  NEURO  CHIEF COMPLAINT:  ICH  INITIAL PRESENTATION: Unresponsive  STUDIES:    SIGNIFICANT EVENTS: 4/2 I[facility]   HISTORY OF PRESENT ILLNESS:   59 yo female with known htn and non-compliant with meds was in usual state of health when on 4/4 she fell down unresponsive and was transferred to [facility] ED as code stroke. Found to have [place] and require intubation and medical management. PCCM asked to assist in her care. She will be started on 3% saline and PCCM asked to place cvl.   PAST MEDICAL HISTORY :   has a past medical history of Shoulder pain; Lipoma of shoulder; Tobacco abuse; Cocaine abuse; Headache; Arthritis (left arm); No pertinent past medical history; COPD (chronic obstructive pulmonary disease) (09/02/2011); Hypertension; Hyperlipidemia; Dysrhythmia; Bronchitis; Lung mass; Constipation; Blood transfusion (2001 or 2002); and CIGARETTE SMOKER (04/17/2006).  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 Prior to Admission medications   Medication Sig Start Date End Date Taking? Authorizing Provider  amLODipine (NORVASC) 10 MG tablet Take 1 tablet (10 mg total) by mouth daily. 04/22/12 04/22/13  Sid Falcon, MD  atorvastatin (LIPITOR) 40 MG tablet Take 1 tablet (40 mg total) by mouth daily. 07/24/12 07/24/13  Hadassah Pais, MD  hydrochlorothiazide (HYDRODIURIL) 25 MG tablet TAKE ONE TABLET BY MOUTH EVERY DAY 05/14/12   Hadassah Pais, MD  metoprolol succinate (TOPROL-XL) 100 MG 24 hr tablet Take 50 mg by mouth daily. Take with or immediately following a meal. 09/06/11 09/05/12  Hadassah Pais, MD  oxyCODONE-acetaminophen (PERCOCET) 10-325 MG per tablet Take 1 tablet by mouth every 4 (four) hours as needed.    Historical Provider, MD  quinapril (ACCUPRIL)  40 MG tablet Take 1 tablet (40 mg total) by mouth 2 (two) times daily. 06/07/12 06/07/13  Dorian Heckle, MD   No Known Allergies  FAMILY HISTORY:  indicated that her mother is alive. She indicated that her father is deceased. She indicated that her sister is alive. She indicated that three of her four brothers are alive. She indicated that her daughter is alive. She indicated that all of her three sons are alive. She indicated that her other is alive.  SOCIAL HISTORY:  reports that she quit smoking about 3 years ago. Her smoking use included Cigarettes. She has a 35 pack-year smoking history. She has never used smokeless tobacco. She reports that she uses illicit drugs (Marijuana). She reports that she does not drink alcohol.  REVIEW OF SYSTEMS: NA  SUBJECTIVE:   VITAL SIGNS: Temp:  [92.8 F (33.8 C)-97.7 F (36.5 C)] 95.2 F (35.1 C) (04/02 1151) Pulse Rate:  [62-140] 125 (04/02 1151) Resp:  [12-34] 20 (04/02 1230) BP: (155-252)/(91-133) 163/99 mmHg (04/02 1230) SpO2:  [97 %-100 %] 100 % (04/02 1151) FiO2 (%):  [32 %-50 %] 50 % (04/02 1159) Weight:  [136 lb (61.689 kg)] 136 lb (61.689 kg) (04/02 1041) HEMODYNAMICS:   VENTILATOR SETTINGS: Vent Mode:  [-] PRVC FiO2 (%):  [32 %-50 %] 50 % Set Rate:  [20 bmp] 20 bmp Vt Set:  [500 mL] 500 mL PEEP:  [5 cmH20] 5 cmH20 Plateau Pressure:  [13 cmH20] 13 cmH20 INTAKE / OUTPUT: No intake or output data in the 24 hours ending 10/20/2014 1316  PHYSICAL EXAMINATION: General:  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 Left pupil 5 mm unresponsive rt pinpoint HEENT:  +jvd  Cardiovascular:  HSR RRR Lungs:  CTA Abdomen:  Soft +bs Musculoskeletal:  intact Skin:  Warm and dry  LABS:  CBC  Recent Labs Lab 10/05/2014 1032 10/15/2014 1040  WBC 6.6  --   HGB 14.6 16.0*  HCT 44.8 47.0*  PLT 163  --    Coag's  Recent Labs Lab 10/13/2014 1032  APTT 31  INR 1.00   BMET  Recent Labs Lab 09/30/2014 1032 10/10/2014 1040  NA 139 143   K 3.3* 3.1*  CL 107 104  CO2 26  --   BUN 14 15  CREATININE 1.13* 1.00  GLUCOSE 130* 132*   Electrolytes  Recent Labs Lab 10/15/2014 1032  CALCIUM 8.7   Sepsis Markers No results for input(s): LATICACIDVEN, PROCALCITON, O2SATVEN in the last 168 hours. ABG No results for input(s): PHART, PCO2ART, PO2ART in the last 168 hours. Liver Enzymes  Recent Labs Lab 10/01/2014 1032  AST 25  ALT 10  ALKPHOS 62  BILITOT 0.5  ALBUMIN 3.7   Cardiac Enzymes No results for input(s): TROPONINI, PROBNP in the last 168 hours. Glucose No results for input(s): GLUCAP in the last 168 hours.  Imaging No results found.   ASSESSMENT / PLAN:  PULMONARY OETT4/2 >> A: VDRF secondary to [place] Tobacco abuse Marijuana use  P:   Vent bundle BD as needed  CARDIOVASCULAR CVL 4/2 rt i j cvl>> A:  HTN P:  PER NEURO  RENAL A:  No acute issue P:     GASTROINTESTINAL A:GI protection P:   PPI  HEMATOLOGIC A:  No acute issue P:    INFECTIOUS A:  No acute issue P:    NE A:  No acute issue  P:     NEUROLOGIC A:  Left thalamic bleed P:   RASS goal: -1 Rt hemiparesis  Per Neuro   FAMILY  - Updates:   - Inter-disciplinary family meet or Palliative Care meeting due by:  day 7    TODAY'S SUMMARY:   59 yo female with known htn and non-compliant with meds was in usual state of health when on 4/4 she fell down unresponsive and was transferred to [facility] ED as code stroke. Found to have [place] and require intubation and medical management. PCCM asked to assist in her care. She will be started on 3% saline and PCCM asked to place cvl.  Richardson Landry Tyrique Sporn ACNP Maryanna Shape PCCM Pager [phone] till 3 pm If no answer page [phone] 10/18/2014, 1:22 PM .

## 2014-09-26 NOTE — ED Provider Notes (Signed)
CSN: [id number]     Arrival date & time 09/29/2014  1019 History   First MD Initiated Contact with Patient 09/25/2014 1020     Chief Complaint  Patient presents with  . Code Stroke   Level V caveat due to altered mental status. @EDPCLEARED @ (Consider location/radiation/quality/duration/timing/severity/associated sxs/prior Treatment) The history is provided by the patient.   patient with code stroke. Flaccid on right side. Began prior to arrival at around 950. Witnessed by family member. Initially hypertensive for EMS. Would follow commands with her left side but was nonverbal.  Past Medical History  Diagnosis Date  . Shoulder pain     Started at July, 2012  . Lipoma of shoulder   . Tobacco abuse     Since 59 year old  . Cocaine abuse   . Headache   . Arthritis left arm  . No pertinent past medical history   . COPD (chronic obstructive pulmonary disease) 09/02/2011  . Hypertension     takes Amlodipine,HCTZ,and Metoprolol daily  . Hyperlipidemia     takes Lipitor daily  . Dysrhythmia     takes Metoprolol daily  . Bronchitis     hx of  . Lung mass     left upper lobe  . Constipation     related to medications and takes OTC stoof softener PRN  . Blood transfusion 2001 or 2002  . CIGARETTE SMOKER 04/17/2006   Past Surgical History  Procedure Laterality Date  . Uterine fibroid surgery       at 2002 to 2003  . Fracture surgery  at age 21    left arm  . Lung lobectomy  10/20/2011    Left upper lobectomy for pulmonary nodules   Family History  Problem Relation Age of Onset  . Hypertension      in mother, 4 brothers, 1 sister and  1 son  . Diabetes type II      mother and 1 sister and 1 brother  . Hypertension Father   . Gout Mother   . Hirschsprung's disease Son   . Anesthesia problems Neg Hx   . Hypotension Neg Hx   . Malignant hyperthermia Neg Hx   . Pseudochol deficiency Neg Hx    History  Substance Use Topics  . Smoking status: Former Smoker -- 1.00 packs/day for  35 years    Types: Cigarettes    Quit date: 02/25/2011  . Smokeless tobacco: Never Used  . Alcohol Use: No     Comment: rare   OB History    No data available     Review of Systems  Unable to perform ROS     Allergies  Review of patient's allergies indicates no known allergies.  Home Medications   Prior to Admission medications   Medication Sig Start Date End Date Taking? Authorizing Provider  amLODipine (NORVASC) 10 MG tablet Take 1 tablet (10 mg total) by mouth daily. 04/22/12 04/22/13  Sid Falcon, MD  atorvastatin (LIPITOR) 40 MG tablet Take 1 tablet (40 mg total) by mouth daily. 07/24/12 07/24/13  Hadassah Pais, MD  hydrochlorothiazide (HYDRODIURIL) 25 MG tablet TAKE ONE TABLET BY MOUTH EVERY DAY 05/14/12   Hadassah Pais, MD  metoprolol succinate (TOPROL-XL) 100 MG 24 hr tablet Take 50 mg by mouth daily. Take with or immediately following a meal. 09/06/11 09/05/12  Hadassah Pais, MD  oxyCODONE-acetaminophen (PERCOCET) 10-325 MG per tablet Take 1 tablet by mouth every 4 (four) hours as needed.    Historical  Provider, MD  quinapril (ACCUPRIL) 40 MG tablet Take 1 tablet (40 mg total) by mouth 2 (two) times daily. 06/07/12 06/07/13  Dorian Heckle, MD   BP 186/76 mmHg  Pulse 69  Temp(Src) 95 F (35 C) (Rectal)  Resp 18  Ht 6' (1.829 m)  Wt 136 lb (61.689 kg)  BMI 18.44 kg/m2  SpO2 100% Physical Exam  Constitutional: She appears well-developed and well-nourished.  HENT:  Head: Normocephalic.  Neck: Neck supple.  Cardiovascular: Normal rate.   Pulmonary/Chest: Effort normal.  Abdominal: Soft.  Musculoskeletal: Normal range of motion.  Neurological: She is alert.  Patient is flaccid on the right side. Will follow commands on the left side. Nonverbal. Eye movements intact. Pupils somewhat constricted.  Skin: Skin is warm.    ED Course  Procedures (including critical care time) Labs Review Labs Reviewed  COMPREHENSIVE METABOLIC PANEL - Abnormal; Notable for the  following:    Potassium 3.3 (*)    Glucose, Bld 130 (*)    Creatinine, Ser 1.13 (*)    GFR calc non Af Amer 53 (*)    GFR calc Af Amer 61 (*)    All other components within normal limits  URINE RAPID DRUG SCREEN (HOSP PERFORMED) - Abnormal; Notable for the following:    Tetrahydrocannabinol POSITIVE (*)    All other components within normal limits  URINALYSIS, ROUTINE W REFLEX MICROSCOPIC - Abnormal; Notable for the following:    Protein, ur 30 (*)    All other components within normal limits  URINE MICROSCOPIC-ADD ON - Abnormal; Notable for the following:    Squamous Epithelial / LPF FEW (*)    All other components within normal limits  I-STAT CHEM 8, ED - Abnormal; Notable for the following:    Potassium 3.1 (*)    Glucose, Bld 132 (*)    Hemoglobin 16.0 (*)    HCT 47.0 (*)    All other components within normal limits  POCT I-STAT 3, ART BLOOD GAS (G3+) - Abnormal; Notable for the following:    pH, Arterial 7.452 (*)    pCO2 arterial 28.5 (*)    pO2, Arterial 242.0 (*)    Acid-base deficit 3.0 (*)    All other components within normal limits  MRSA PCR SCREENING  ETHANOL  PROTIME-INR  APTT  CBC  DIFFERENTIAL  BLOOD GAS, ARTERIAL  SODIUM  SODIUM  SODIUM  I-STAT TROPOININ, ED  I-STAT TROPOININ, ED    Imaging Review Ct Head Wo Contrast  09/27/2014   CLINICAL DATA:  Code stroke. Right side is flaccid. Patient is nonverbal.  EXAM: CT HEAD WITHOUT CONTRAST  TECHNIQUE: Contiguous axial images were obtained from the base of the skull through the vertex without contrast.  COMPARISON:  10/23/2011  FINDINGS: Positive for intracranial hemorrhage. There is a focus of hemorrhage in the left thalamus measuring 1.7 x 2.3 x 2.5 cm. Calculated hematoma volume is close to 5 mL. There is a large amount of blood in the left lateral ventricle. Small amount of blood in the right lateral ventricle. The third and fourth ventricle are filled with blood. Mild enlargement of the temporal horns  bilaterally. Low density in the parietal subcortical white matter probably represents chronic changes. 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 There is approximately 5 mm of left-to-right midline shift. No acute bone abnormality. Visualized sinuses are clear.  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 Mild dilatation of the temporal horns.  Approximately 5 mL of midline shift.  Evidence for old ischemic changes.  Critical Value/emergent results were called by telephone at the time of interpretation on 10/16/2014 at 10:40 am to Dr. Davonna Belling , who verbally acknowledged these results.   Electronically Signed   By: Markus Daft M.D.   On: 10/21/2014 10:52   Dg Chest Port 1 View  10/03/2014   CLINICAL DATA:  Status post central line placement  EXAM: PORTABLE CHEST - 1 VIEW  COMPARISON:  10/23/2014  FINDINGS: Endotracheal tube and nasogastric catheter are again identified and stable. A new right jugular central line is noted in the mid superior vena cava. No pneumothorax is seen. Postsurgical changes are noted in the left lung. No focal infiltrate or sizable effusion is seen.  IMPRESSION: No pneumothorax following central line placement. The catheter tip is in satisfactory position.   Electronically Signed   By: Inez Catalina M.D.   On: 10/04/2014 14:22   Dg Chest Portable 1 View  10/24/2014   CLINICAL DATA:  Acute stroke. Acute respiratory failure. Intubation. COPD.  EXAM: PORTABLE CHEST - 1 VIEW  COMPARISON:  11/14/2011  FINDINGS: Endotracheal tube is seen with tip at level of clavicles, approximately 7 cm above the carina. A nasogastric tube is seen entering the stomach.  Left upper and lower lobe scarring again demonstrated. No evidence of pulmonary infiltrate or edema. No evidence of pleural effusion. Heart size is within normal limits. Ectasia thoracic aorta appears stable. No evidence of pneumothorax.   IMPRESSION: High endotracheal tube position, with tip approximately 7 cm above carina.  Mild left lung scarring.  No acute lung disease.   Electronically Signed   By: Earle Gell M.D.   On: 10/17/2014 13:20     EKG Interpretation None      MDM   Final diagnoses:  ICH (intracerebral hemorrhage)    Patient came in as a code stroke. Found to have basal ganglia bleed into her ventricles. Mental status decompensated and she required intubation. Admitted to the ICU by neurology.   INTUBATION Performed by: Mackie Pai  Required items: required blood products, implants, devices, and special equipment available Patient identity confirmed: provided demographic data and hospital-assigned identification number Time out: Immediately prior to procedure a "time out" was called to verify the correct patient, procedure, equipment, support staff and site/side marked as required.  Indications: Mental status change   Intubation method: Glidescope Laryngoscopy   Preoxygenation: BVM  Sedatives: Etomidate Paralytic: roccuronium  Tube Size: 7.5 cuffed  Post-procedure assessment: chest rise and ETCO2 monitor Breath sounds: equal and absent over the epigastrium Tube secured with: ETT holder Chest x-ray interpreted by radiologist and me.  Chest x-ray findings: endotracheal tube in appropriate position  Patient tolerated the procedure well with no immediate complications.      Davonna Belling, MD 10/09/2014 [phone]

## 2014-09-26 NOTE — ED Notes (Signed)
Pt still vomiting; zofran given

## 2014-09-26 NOTE — Progress Notes (Signed)
Patient intubated by MD in ER with a 7.5 ETT taped at 23@ Lip good color change on end tidal CO2 detector, Good BBS ausculted over lung fields, chest X-Ray ordered.

## 2014-09-26 NOTE — Progress Notes (Signed)
Patient's heart rate maintaining in the 120s. Dr. Aram Beecham notified. 10mg  Labetalol ordered. Will continue to monitor. Thayer Ohm D

## 2014-09-26 NOTE — Procedures (Signed)
Central Venous Catheter Insertion Procedure Note Margaret Mcmillan [id number] [birth year]  Procedure: Insertion of Central Venous Catheter Indications: Assessment of intravascular volume, Drug and/or fluid administration and Frequent blood sampling  Procedure Details Consent: Unable to obtain consent because of altered level of consciousness. Time Out: Verified patient identification, verified procedure, site/side was marked, verified correct patient position, special equipment/implants available, medications/allergies/relevent history reviewed, required imaging and test results available.  Performed  Maximum sterile technique was used including antiseptics, cap, gloves, gown, hand hygiene, mask and sheet. Skin prep: Chlorhexidine; local anesthetic administered A antimicrobial bonded/coated triple lumen catheter was placed in the right internal jugular vein using the Seldinger technique. Ultrasound guidance used.Yes.   Catheter placed to 16 cm. Blood aspirated via all 3 ports and then flushed x 3. Line sutured x 2 and dressing applied.  Evaluation Blood flow good Complications: No apparent complications Patient did tolerate procedure well. Chest X-ray ordered to verify placement.  CXR: pending.  Richardson Landry Minor ACNP Maryanna Shape PCCM Pager [phone] till 3 pm If no answer page [phone] 10/21/2014, 1:15 PM

## 2014-09-26 NOTE — Progress Notes (Signed)
STAT EEG completed; results pending. 

## 2014-09-26 NOTE — ED Notes (Signed)
Pt was at home doing household chores with boyfriend; sudden collapse; right sided weakness and right droop. Nonverbal. Moving all extremities and following commands.

## 2014-09-26 NOTE — Progress Notes (Signed)
Patient ID: Margaret Mcmillan [facility], female   DOB: 1955-11-25, 59 y.o.   MRN: [id number] ivc inserted. Csf drained at high pressue. Open to 10 cm of h2o

## 2014-09-26 NOTE — Progress Notes (Deleted)
Patient's 2000 Na level 139. Dr. Aram Beecham paged. Order to increase 3% saline to 100 ml/hr. Thayer Ohm D

## 2014-09-26 NOTE — H&P (Signed)
Admission H&P    Chief Complaint: Unresponsive  HPI: Margaret Mcmillan is an 59 y.o. female who awakened normal.  Was up doing chores around the house and had a sudden collapse.  Patient became nonverbal and was noted to have right sided weakness.  EMS was called at that time and patient was brought in as a code stroke. Patient had not taken her antihypertensive medications for about 2 years.  Required multiple meds with only moderate control prior to that.    Date last known well: Date: 10/03/2014 Time last known well: Time: 09:50 tPA Given: No: ICH Pre-presentation mRankin-0   Past Medical History  Diagnosis Date  . Shoulder pain     Started at July, 2012  . Lipoma of shoulder   . Tobacco abuse     Since 59 year old  . Cocaine abuse   . Headache   . Arthritis left arm  . No pertinent past medical history   . COPD (chronic obstructive pulmonary disease) 09/02/2011  . Hypertension     takes Amlodipine,HCTZ,and Metoprolol daily  . Hyperlipidemia     takes Lipitor daily  . Dysrhythmia     takes Metoprolol daily  . Bronchitis     hx of  . Lung mass     left upper lobe  . Constipation     related to medications and takes OTC stoof softener PRN  . Blood transfusion 2001 or 2002  . CIGARETTE SMOKER 04/17/2006    Past Surgical History  Procedure Laterality Date  . Uterine fibroid surgery       at 2002 to 2003  . Fracture surgery  at age 68    left arm  . Lung lobectomy  10/20/2011    Left upper lobectomy for pulmonary nodules    Family History  Problem Relation Age of Onset  . Hypertension      in mother, 4 brothers, 1 sister and  1 son  . Diabetes type II      mother and 1 sister and 1 brother  . Hypertension Father   . Gout Mother   . Hirschsprung's disease Son   . Anesthesia problems Neg Hx   . Hypotension Neg Hx   . Malignant hyperthermia Neg Hx   . Pseudochol deficiency Neg Hx    Social History:  reports that she quit smoking about 3 years ago. Her smoking  use included Cigarettes. She has a 35 pack-year smoking history. She has never used smokeless tobacco. She reports that she uses illicit drugs (Marijuana). She reports that she does not drink alcohol.  Allergies: No Known Allergies  Medications: Current outpatient prescriptions:  .  amLODipine (NORVASC) 10 MG tablet, Take 1 tablet (10 mg total) by mouth daily., Disp: 90 tablet, Rfl: 3 .  atorvastatin (LIPITOR) 40 MG tablet, Take 1 tablet (40 mg total) by mouth daily., Disp: 90 tablet, Rfl: 4 .  hydrochlorothiazide (HYDRODIURIL) 25 MG tablet, TAKE ONE TABLET BY MOUTH EVERY DAY, Disp: 30 tablet, Rfl: 5 .  metoprolol succinate (TOPROL-XL) 100 MG 24 hr tablet, Take 50 mg by mouth daily. Take with or immed following a meal .  oxyCODONE-acetaminophen (PERCOCET) 10-325 MG per tablet, Take 1 tablet by mouth every 4 (four) hours as needed .  quinapril (ACCUPRIL) 40 MG tablet, Take 1 tablet (40 mg total) by mouth 2 (two) times daily., Disp: 60 tablet, Rfl: 3 .  [DISCONTINUED] metoprolol tartrate (LOPRESSOR) 25 MG tablet, Take 1 tablet (25 mg total) by mouth 2 (two)  times daily., Disp: 60 tablet, Rfl: 11   ROS: Unable to obtain  Physical Examination: Blood pressure 235/133, pulse 138, temperature 95.5 F (35.3 C), temperature source Rectal, resp. rate 25, height 6' (1.829 m), weight 61.689 kg (136 lb), SpO2 100 %.  General Examination: HEENT-  Normocephalic, no lesions, without obvious abnormality.  Normal external eye and conjunctiva.  Normal TM's bilaterally.  Normal auditory canals and external ears. Normal external nose, mucus membranes and septum.  Normal pharynx. Cardiovascular- S1, S2 normal, pulses palpable throughout   Lungs- chest clear, no wheezing, rales, normal symmetric air entry Abdomen- soft, non-tender; bowel sounds normal; no masses,  no organomegaly Extremities- no edema Lymph-no adenopathy palpable Musculoskeletal-no joint tenderness, deformity or swelling Skin-warm and dry, no  hyperpigmentation, vitiligo, or suspicious lesions  Neurological Examination Mental Status: Alert.  Mute.  Follows simple commands without difficulty. Cranial Nerves: II: Discs flat bilaterally; Blinks to confrontation from the left but not from the right, pupils 78mm but equal, round, reactive to light and accommodation III,IV, VI: ptosis not present, oculocephalic maneuvers intact V,VII: right facial droop, absent right corneal VIII: hearing normal bilaterally IX,X: gag reflex reduced XI: decreased shoulder shrug on the right XII: unable to test Motor: Right : Upper extremity   0/5    Left:     Upper extremity   5/5  Lower extremity   0/5     Lower extremity   5/5 Tone and bulk:normal tone throughout; no atrophy noted Sensory: Does not respond to noxious stimuli on the right Deep Tendon Reflexes: 2+ and symmetric throughout Plantars: Right: upgoing   Left: mute Cerebellar: Unable to test Gait: Unable to test    Laboratory Studies:   Basic Metabolic Panel:  Recent Labs Lab 10/05/2014 1032 10/04/2014 1040  NA 139 143  K 3.3* 3.1*  CL 107 104  CO2 26  --   GLUCOSE 130* 132*  BUN 14 15  CREATININE 1.13* 1.00  CALCIUM 8.7  --     Liver Function Tests:  Recent Labs Lab 09/30/2014 1032  AST 25  ALT 10  ALKPHOS 62  BILITOT 0.5  PROT 6.6  ALBUMIN 3.7   No results for input(s): LIPASE, AMYLASE in the last 168 hours. No results for input(s): AMMONIA in the last 168 hours.  CBC:  Recent Labs Lab 09/29/2014 1032 10/02/2014 1040  WBC 6.6  --   NEUTROABS 3.5  --   HGB 14.6 16.0*  HCT 44.8 47.0*  MCV 89.1  --   PLT 163  --     Cardiac Enzymes: No results for input(s): CKTOTAL, CKMB, CKMBINDEX, TROPONINI in the last 168 hours.  BNP: Invalid input(s): POCBNP  CBG: No results for input(s): GLUCAP in the last 168 hours.  Microbiology: Results for orders placed or performed during the hospital encounter of 10/20/11  AFB culture with smear     Status: None    Collection Time: 10/20/11  4:24 PM  Result Value Ref Range Status   Specimen Description TISSUE LUNG LEFT  Final   Special Requests NONE  Final   Acid Fast Smear NO ACID FAST BACILLI SEEN  Final   Culture NO ACID FAST BACILLI ISOLATED IN 6 WEEKS  Final   Report Status 12/03/2011 FINAL  Final  Fungus culture w smear     Status: None   Collection Time: 10/20/11  4:24 PM  Result Value Ref Range Status   Specimen Description TISSUE LUNG LEFT  Final   Special Requests NONE  Final  Fungal Smear NO YEAST OR FUNGAL ELEMENTS SEEN  Final   Culture No Fungi Isolated in 4 Weeks  Final   Report Status 11/17/2011 FINAL  Final    Coagulation Studies:  Recent Labs  10/13/2014 1032  LABPROT 13.3  INR 1.00    Urinalysis: No results for input(s): COLORURINE, LABSPEC, PHURINE, GLUCOSEU, HGBUR, BILIRUBINUR, KETONESUR, PROTEINUR, UROBILINOGEN, NITRITE, LEUKOCYTESUR in the last 168 hours.  Invalid input(s): APPERANCEUR  Lipid Panel:     Component Value Date/Time   CHOL 187 06/04/2011 0620   TRIG 59 06/04/2011 0620   HDL 62 06/04/2011 0620   CHOLHDL 3.0 06/04/2011 0620   VLDL 12 06/04/2011 0620   LDLCALC 113* 06/04/2011 0620    HgbA1C: No results found for: HGBA1C  Urine Drug Screen:      Component Value Date/Time   LABOPIA NONE DETECTED 06/04/2011 1611   COCAINSCRNUR NONE DETECTED 06/04/2011 1611   LABBENZ NONE DETECTED 06/04/2011 1611   AMPHETMU NONE DETECTED 06/04/2011 1611   THCU POSITIVE* 06/04/2011 1611   LABBARB NONE DETECTED 06/04/2011 1611    Alcohol Level:   Recent Labs Lab 10/16/2014 1032  ETH <5    Other results: EKG: sinus rhythm at 74 bpm, LVH.  Imaging: Ct Head Wo Contrast  09/27/2014   CLINICAL DATA:  Code stroke. Right side is flaccid. Patient is nonverbal.  EXAM: CT HEAD WITHOUT CONTRAST  TECHNIQUE: Contiguous axial images were obtained from the base of the skull through the vertex without contrast.  COMPARISON:  10/23/2011  FINDINGS: Positive for intracranial  hemorrhage. There is a focus of hemorrhage in the left thalamus measuring 1.7 x 2.3 x 2.5 cm. Calculated hematoma volume is close to 5 mL. There is a large amount of blood in the left lateral ventricle. Small amount of blood in the right lateral ventricle. The third and fourth ventricle are filled with blood. Mild enlargement of the temporal horns bilaterally. Low density in the parietal subcortical white matter probably represents chronic changes. 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 There is approximately 5 mm of left-to-right midline shift. No acute bone abnormality. Visualized sinuses are clear.  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 Mild dilatation of the temporal horns.  Approximately 5 mL of midline shift.  Evidence for old ischemic changes.  Critical Value/emergent results were called by telephone at the time of interpretation on 09/29/2014 at 10:40 am to Dr. Davonna Belling , who verbally acknowledged these results.   Electronically Signed   By: Markus Daft M.D.   On: 10/11/2014 10:52    Assessment: 59 y.o. female presenting with expressive aphasia and right sided weakness.  Head CT personally reviewed and shows a left thalamic hemorrhage with interventricular extension.  There is 41mm of midline shift.  BP markedly elevated.      Stroke Risk Factors - hypertension  Plan: 1. HgbA1c, fasting lipid panel 2. MRI, MRA  of the brain without contrast 3. PT consult, OT consult, Speech consult 4. Echocardiogram 5. Repeat head CT in 24 hours 6. Prophylactic therapy-None 7. NPO until RN stroke swallow screen 8. Telemetry monitoring 9. Frequent neuro checks 10. Cardene for BP control 11. Zofran q 6 prn 12. NICU admission  Addendum: While in ED patient became unresponsive and unable to follow commands.  Concern was for her ability to protect her airway.  Patient required intubation.  CCM called  for ventilator management and placement of central line.  Patient  will be started on 3% saline.  This patient is critically ill and at significant risk of neurological worsening, death and care requires constant monitoring of vital signs, hemodynamics,respiratory and cardiac monitoring, neurological assessment, discussion with family, other specialists and medical decision making of high complexity. I spent 120 minutes of neurocritical care time  in the care of  this patient.   Alexis Goodell, MD [facility] [phone] 10/24/2014, 11:43 AM

## 2014-09-26 NOTE — Progress Notes (Signed)
Code stroke called at 33, patient arrived to [facility] ED via [facility] at 1019.  Patient LSN at [phone], was at home cleaning, family was present patient collapsed, right side weakness and aphasia and facial droop.  NIHSS 23, patient vomiting zofran given, patient hypertensive 252/124, cardene drip started at 5 mg.  ICU bed requested.

## 2014-09-26 NOTE — ED Notes (Signed)
MD Pickering at bedside.  

## 2014-09-26 NOTE — Op Note (Signed)
NAMECOLLYN, SELK NO.:  [id number]  MEDICAL RECORD NO.:  [id number]  LOCATION:  3M09C                        FACILITY:  [facility]  PHYSICIAN:  Leeroy Cha, M.D.   DATE OF BIRTH:  04/08/1956  DATE OF PROCEDURE:  10/05/2014 DATE OF DISCHARGE:                              OPERATIVE REPORT   CLINICAL HISTORY:  I was called by Dr. Doy Mince to take a look at Ms. Honeyman who was admitted with a stroke.  The patient has a thalamic stroke with blood in the left ventricle.  She has developed hydrocephalus.  By the time I came, she was bradycardic with the pupils were around 5 mm.  I talked to the husband and we agreed to proceed with emergency intraventricular catheter.  PROCEDURE:  The right side of the head was shaved and prepped and cleaned with DuraPrep.  Drapes were applied.  Incision away from the midline in front of the suture was made.  A hole was made in the frontal bone and the dura mater was opened with an 18-gauge needle. Immediately, a catheter was inserted into the right ventricle.  The CSF came back bloody at high pressure.  The end part of the catheter was torn for a different incision and it was secured in place.  We used 3-0 nylon to secure the catheter in place.  The drain is going to drain at 10 cm of water.  We will follow the patient while she is in the ICU. Prognosis as per the Stroke Team.          ______________________________ Leeroy Cha, M.D.     EB/MEDQ  D:  10/09/2014  T:  10/15/2014  Job:  [id number]

## 2014-09-26 NOTE — Progress Notes (Signed)
Advanced ETT to 25 cm at lip per Dr Alvino Chapel.

## 2014-09-27 ENCOUNTER — Inpatient Hospital Stay (HOSPITAL_COMMUNITY): Payer: Medicaid Other

## 2014-09-27 DIAGNOSIS — I10 Essential (primary) hypertension: Secondary | ICD-10-CM

## 2014-09-27 DIAGNOSIS — G935 Compression of brain: Secondary | ICD-10-CM

## 2014-09-27 DIAGNOSIS — G936 Cerebral edema: Secondary | ICD-10-CM

## 2014-09-27 DIAGNOSIS — I610 Nontraumatic intracerebral hemorrhage in hemisphere, subcortical: Secondary | ICD-10-CM | POA: Insufficient documentation

## 2014-09-27 DIAGNOSIS — I615 Nontraumatic intracerebral hemorrhage, intraventricular: Secondary | ICD-10-CM

## 2014-09-27 LAB — CBC
HEMATOCRIT: 43.1 % (ref 36.0–46.0)
HEMOGLOBIN: 14.3 g/dL (ref 12.0–15.0)
MCH: 29.1 pg (ref 26.0–34.0)
MCHC: 33.2 g/dL (ref 30.0–36.0)
MCV: 87.8 fL (ref 78.0–100.0)
Platelets: 162 10*3/uL (ref 150–400)
RBC: 4.91 MIL/uL (ref 3.87–5.11)
RDW: 15.5 % (ref 11.5–15.5)
WBC: 12.1 10*3/uL — ABNORMAL HIGH (ref 4.0–10.5)

## 2014-09-27 LAB — SODIUM
SODIUM: 155 mmol/L — AB (ref 135–145)
Sodium: 157 mmol/L — ABNORMAL HIGH (ref 135–145)

## 2014-09-27 LAB — PROTIME-INR
INR: 1.13 (ref 0.00–1.49)
PROTHROMBIN TIME: 14.6 s (ref 11.6–15.2)

## 2014-09-27 LAB — BASIC METABOLIC PANEL
ANION GAP: 8 (ref 5–15)
BUN: 9 mg/dL (ref 6–23)
CALCIUM: 8.2 mg/dL — AB (ref 8.4–10.5)
CO2: 22 mmol/L (ref 19–32)
Chloride: 126 mmol/L — ABNORMAL HIGH (ref 96–112)
Creatinine, Ser: 1.09 mg/dL (ref 0.50–1.10)
GFR calc non Af Amer: 55 mL/min — ABNORMAL LOW (ref >90)
GFR, EST AFRICAN AMERICAN: 64 mL/min — AB (ref >90)
Glucose, Bld: 137 mg/dL — ABNORMAL HIGH (ref 70–99)
POTASSIUM: 3.4 mmol/L — AB (ref 3.5–5.1)
SODIUM: 156 mmol/L — AB (ref 135–145)

## 2014-09-27 LAB — APTT: aPTT: 30 seconds (ref 24–37)

## 2014-09-27 LAB — MAGNESIUM: Magnesium: 1.7 mg/dL (ref 1.5–2.5)

## 2014-09-27 LAB — PHOSPHORUS: Phosphorus: 1.3 mg/dL — ABNORMAL LOW (ref 2.3–4.6)

## 2014-09-27 MED ORDER — LABETALOL HCL 5 MG/ML IV SOLN
10.0000 mg | Freq: Once | INTRAVENOUS | Status: AC
  Administered 2014-09-27: 10 mg via INTRAVENOUS
  Filled 2014-09-27: qty 4

## 2014-09-27 MED ORDER — LORAZEPAM 2 MG/ML IJ SOLN: 2.0000 mg | INTRAMUSCULAR | Status: DC | PRN

## 2014-09-27 MED ORDER — MORPHINE SULFATE 25 MG/ML IV SOLN
1.0000 mg/h | INTRAVENOUS | Status: DC
  Administered 2014-09-27: 10 mg/h via INTRAVENOUS
  Administered 2014-09-28: 7 mg/h via INTRAVENOUS
  Filled 2014-09-27 (×2): qty 10

## 2014-09-27 MED ORDER — POTASSIUM PHOSPHATES 15 MMOLE/5ML IV SOLN
30.0000 mmol | Freq: Once | INTRAVENOUS | Status: DC
  Administered 2014-09-27: 30 mmol via INTRAVENOUS
  Filled 2014-09-27: qty 10

## 2014-09-27 NOTE — Procedures (Signed)
ELECTROENCEPHALOGRAM REPORT   Patient: Margaret Mcmillan       Room #: 0H21 EEG No. ID: [id number] Age: 59 y.o.        Sex: female Referring Physician: Erlinda Hong Report Date:  10/12/2014        Interpreting Physician: Alexis Goodell  History: Avie Checo is an 59 y.o. 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  Medications:  Scheduled: .  stroke: mapping our early stages of recovery book   Does not apply Once  . antiseptic oral rinse  7 mL Mouth Rinse QID  . chlorhexidine  15 mL Mouth Rinse BID  . levETIRAcetam  500 mg Intravenous Q12H  . pantoprazole (PROTONIX) IV  40 mg Intravenous QHS  . potassium phosphate IVPB (mmol)  30 mmol Intravenous Once  . senna-docusate  1 tablet Oral BID    Conditions of Recording:  This is a 16 channel EEG carried out with the patient in the intubated and sedated state.  IVC drain on the right.  Description:  The background activity over the right hemisphere is obscured by muscle and movement artifact.  The left hemispheric background activity is discontinuous.  It consists of a low voltage polymorphic delta activity alternating with higher voltage bursts of faster activity.  This same activity is suggested over the right hemisphere with the frequencies overall being slower. This discontinuous activity is maintained throughout the tracing.   No epileptiform activity is noted.   Hyperventilation and intermittent photic stimulation were not performed   IMPRESSION: This is an abnormal electroencephalogram secondary to burst suppression activity.  Although this activity is seen throughout, it is slower over the right hemisphere consistent with the IVC drain placement and hemorrhage location.  The burst suppression activity can be seenas a medication effect or in the post-ictal state.     Alexis Goodell, MD [facility] [phone] 09/27/2014, 10:52 AM

## 2014-09-27 NOTE — Progress Notes (Signed)
Left carotid artery duplex completed:  1-39% ICA stenosis.  Vertebral artery flow is antegrade.  Right carotid artery duplex not done due to line in the neck.  Reorder once the line is out, if the test is necessary.

## 2014-09-27 NOTE — Progress Notes (Signed)
Chaplain stopped with husband to offer comfort.  Husband had several procedural questions which I clarified.  Talking about his support system, which sounds strong.  Patient's brother was sitting with husband to give support, which sounds like strong relationship.  Rev. [place], [place]

## 2014-09-27 NOTE — Progress Notes (Signed)
SLP Cancellation Note  Patient Details Name: Margaret Mcmillan MRN: [id number] DOB: 01/08/1956   Cancelled treatment:       Reason Eval/Treat Not Completed: Medical issues which prohibited therapy (on vent. )  Signing off. Please reconsult when appropriate.   [place], CCC-SLP [phone]    Margaret Mcmillan 09/27/2014, 8:07 AM

## 2014-09-27 NOTE — Progress Notes (Signed)
0200 Na 157. Dr. Aram Beecham notified. Order to decreased 3% infusion to 50 ml/hr. Thayer Ohm D

## 2014-09-27 NOTE — Progress Notes (Signed)
Patient ID: Margaret Mcmillan [facility], [place], female   DOB: 12-28-1955, 59 y.o.   MRN: [id number] No responding, pupils dilated. ivc working. Decision as per neurology

## 2014-09-27 NOTE — Progress Notes (Signed)
Chaplain was called by Network engineer in 3S due to man in waiting room very upset.  Family member of pt. In 3S responded to man's tears and gave comfort. Family began to gather...chaplain offered comfort measures, spiritual conversation.  Will follow.  Rev. [place], [place]

## 2014-09-27 NOTE — Progress Notes (Signed)
Transported patient to CT without complications.

## 2014-09-27 NOTE — Procedures (Signed)
Extubation Procedure Note  Patient Details:   Name: Margaret Mcmillan DOB: 06/11/1956 MRN: [id number]   Airway Documentation:  Airway 7.5 mm (Active)  Secured at (cm) 25 cm 09/27/2014 11:37 AM  Measured From Lips 09/27/2014 11:37 AM  [place] 09/27/2014 11:37 AM  Secured By [employer] 09/27/2014 11:37 AM  Tube Holder Repositioned Yes 09/27/2014 11:37 AM  Cuff Pressure (cm H2O) 22 cm H2O 09/27/2014  8:36 AM  Site Condition Dry 09/27/2014  4:52 AM    Evaluation  O2 sats: currently acceptable Complications: No apparent complications Patient did tolerate procedure well. Bilateral Breath Sounds: Clear Suctioning: Oral No  Hope Pigeon, MA 09/27/2014, 1:43 PM

## 2014-09-27 NOTE — Plan of Care (Signed)
Patient seen and evaluated.  All the members of the family have been explained about the diagnosis, current plan and prognosis. They are in unanimous decision (as per patient wishes) that they want withdraw of care and comfort care given the nature of the condition is serious and terminal. Pt has suffered a devastating injury with no chance of meaningful recovery and is in a terminal condition. As per family wishes we will withdraw care and stop any life prolonging therapies. I agree with DNR, withdraw of care and comfort care.   Rosalin Hawking, MD PhD Stroke Neurology 09/27/2014 1:29 PM

## 2014-09-27 NOTE — Consult Note (Signed)
PULMONARY / CRITICAL CARE MEDICINE   Name: Margaret Mcmillan MRN: [id number] DOB: 05-13-1956    ADMISSION DATE:  10/07/2014 CONSULTATION DATE: 4/2  REFERRING MD :  NEURO  CHIEF COMPLAINT:  ICH  INITIAL PRESENTATION: Unresponsive  STUDIES:    SIGNIFICANT EVENTS: 4/2 ICH   HISTORY OF PRESENT ILLNESS:   59 yo female with known htn and non-compliant with meds was in usual state of health when on 4/4 she fell down unresponsive and was transferred to [facility] ED as code stroke. Found to have Mall[place] and require intubation and medical management. PCCM asked to assist in her care. She will be started on 3% saline and PCCM asked to place cvl.  SUBJECTIVE: remains unresponsive Low gr fever Na high  VITAL SIGNS: Temp:  [92.8 F (33.8 C)-100.4 F (38 C)] 99.9 F (37.7 C) (04/03 0645) Pulse Rate:  [60-140] 116 (04/03 0715) Resp:  [12-34] 21 (04/03 0715) BP: (114-252)/(62-133) 157/92 mmHg (04/03 0715) SpO2:  [97 %-100 %] 100 % (04/03 0715) FiO2 (%):  [30 %-50 %] 30 % (04/03 0700) Weight:  [55.7 kg (122 lb 12.7 oz)-61.689 kg (136 lb)] 55.7 kg (122 lb 12.7 oz) (04/02 1200) HEMODYNAMICS:   VENTILATOR SETTINGS: Vent Mode:  [-] PRVC FiO2 (%):  [30 %-50 %] 30 % Set Rate:  [16 bmp-20 bmp] 16 bmp Vt Set:  [500 mL] 500 mL PEEP:  [5 cmH20] 5 cmH20 Plateau Pressure:  [13 cmH20-16 cmH20] 15 cmH20 INTAKE / OUTPUT:  Intake/Output Summary (Last 24 hours) at 09/27/14 0737 Last data filed at 09/27/14 0700  Gross per 24 hour  Intake 2516.12 ml  Output   2672 ml  Net -155.88 ml    PHYSICAL EXAMINATION: General:  Thub AAF on vent Neuro:  Comatose ,Lt pupil 71mm NRTL, rt 46mm NRTL, corneal absent on right, spont resp + HEENT:  +jvd  Cardiovascular:  HSR RRR Lungs:  CTA Abdomen:  Soft +bs Musculoskeletal:  intact Skin:  Warm and dry  LABS:  CBC  Recent Labs Lab 10/24/2014 1032 10/04/2014 1040 09/27/14 0400  WBC 6.6  --  12.1*  HGB 14.6 16.0* 14.3  HCT 44.8 47.0* 43.1  PLT 163  --  162    Coag's  Recent Labs Lab 10/03/2014 1032 09/27/14 0400  APTT 31 30  INR 1.00 1.13   BMET  Recent Labs Lab 10/23/2014 1032 10/16/2014 1040  09/29/2014 2100 09/27/14 0200 09/27/14 0400  NA 139 143  < > 150* 157* 156*  K 3.3* 3.1*  --   --   --  3.4*  CL 107 104  --   --   --  126*  CO2 26  --   --   --   --  22  BUN 14 15  --   --   --  9  CREATININE 1.13* 1.00  --   --   --  1.09  GLUCOSE 130* 132*  --   --   --  137*  < > = values in this interval not displayed. Electrolytes  Recent Labs Lab 10/17/2014 1032 09/27/14 0400  CALCIUM 8.7 8.2*  MG  --  1.7  PHOS  --  1.3*   Sepsis Markers No results for input(s): LATICACIDVEN, PROCALCITON, O2SATVEN in the last 168 hours. ABG  Recent Labs Lab 10/13/2014 1327  PHART 7.452*  PCO2ART 28.5*  PO2ART 242.0*   Liver Enzymes  Recent Labs Lab 09/25/2014 1032  AST 25  ALT 10  ALKPHOS 62  BILITOT 0.5  ALBUMIN 3.7   Cardiac Enzymes No results for input(s): TROPONINI, PROBNP in the last 168 hours. Glucose No results for input(s): GLUCAP in the last 168 hours.  Imaging Ct Head Wo Contrast  10/21/2014   CLINICAL DATA:  Code stroke. Right side is flaccid. Patient is nonverbal.  EXAM: CT HEAD WITHOUT CONTRAST  TECHNIQUE: Contiguous axial images were obtained from the base of the skull through the vertex without contrast.  COMPARISON:  10/23/2011  FINDINGS: Positive for intracranial hemorrhage. There is a focus of hemorrhage in the left thalamus measuring 1.7 x 2.3 x 2.5 cm. Calculated hematoma volume is close to 5 mL. There is a large amount of blood in the left lateral ventricle. Small amount of blood in the right lateral ventricle. The third and fourth ventricle are filled with blood. Mild enlargement of the temporal horns bilaterally. Low density in the parietal subcortical white matter probably represents chronic changes. 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 There is  approximately 5 mm of left-to-right midline shift. No acute bone abnormality. Visualized sinuses are clear.  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 Mild dilatation of the temporal horns.  Approximately 5 mL of midline shift.  Evidence for old ischemic changes.  Critical Value/emergent results were called by telephone at the time of interpretation on 10/19/2014 at 10:40 am to Dr. Davonna Belling , who verbally acknowledged these results.   Electronically Signed   By: Markus Daft M.D.   On: 10/02/2014 10:52   Dg Chest Port 1 View  10/23/2014   CLINICAL DATA:  Status post central line placement  EXAM: PORTABLE CHEST - 1 VIEW  COMPARISON:  10/17/2014  FINDINGS: Endotracheal tube and nasogastric catheter are again identified and stable. A new right jugular central line is noted in the mid superior vena cava. No pneumothorax is seen. Postsurgical changes are noted in the left lung. No focal infiltrate or sizable effusion is seen.  IMPRESSION: No pneumothorax following central line placement. The catheter tip is in satisfactory position.   Electronically Signed   By: Inez Catalina M.D.   On: 10/10/2014 14:22   Dg Chest Portable 1 View  09/27/2014   CLINICAL DATA:  Acute stroke. Acute respiratory failure. Intubation. COPD.  EXAM: PORTABLE CHEST - 1 VIEW  COMPARISON:  11/14/2011  FINDINGS: Endotracheal tube is seen with tip at level of clavicles, approximately 7 cm above the carina. A nasogastric tube is seen entering the stomach.  Left upper and lower lobe scarring again demonstrated. No evidence of pulmonary infiltrate or edema. No evidence of pleural effusion. Heart size is within normal limits. Ectasia thoracic aorta appears stable. No evidence of pneumothorax.  IMPRESSION: High endotracheal tube position, with tip approximately 7 cm above carina.  Mild left lung scarring.  No acute lung disease.   Electronically Signed   By: Earle Gell M.D.   On: 10/23/2014 13:20   Dg Abd  Portable 1v  09/27/2014   CLINICAL DATA:  Orogastric tube placement  EXAM: PORTABLE ABDOMEN - 1 VIEW  COMPARISON:  None.  FINDINGS: The gastric tube extends well into the stomach with tip in the region of the antrum.  IMPRESSION: Orogastric tube extends well into the stomach   Electronically Signed   By: Andreas Newport M.D.   On: 09/27/2014 02:11     ASSESSMENT / PLAN:  PULMONARY OETT 4/2 >> A: VDRF secondary to [place] Tobacco abuse Marijuana use  P:  Vent bundle BD as needed  CARDIOVASCULAR CVL 4/2 rt i j cvl>> A:  HTN P:  Cardene gtt -allow SBP 160 range  RENAL A: Hypernatremia P:  Allow 150-155 range Replete K phos   GASTROINTESTINAL A:GI protection P:   PPI  HEMATOLOGIC A:  No acute issue P:    INFECTIOUS A:  No acute issue P:    endocrine A:  No acute issue  P:  CBGs   NEUROLOGIC A:  Left thalamic bleed + IVC extension s/p ventric Midline shift P:   RASS goal: 0  Per Neuro   FAMILY  - Updates: husband 4/2  - Inter-disciplinary family meet or Palliative Care meeting due by:  day 7    TODAY'S SUMMARY: Guarded prognosis given extensive ICH & herniation   The patient is critically ill with multiple organ systems failure and requires high complexity decision making for assessment and support, frequent evaluation and titration of therapies, application of advanced monitoring technologies and extensive interpretation of multiple databases. Critical Care Time devoted to patient care services described in this note independent of APP time is 31 minutes.    Kara Mead MD. [place].  Pulmonary & Critical care Pager [phone] If no response call [phone]    09/27/2014, 7:37 AM .

## 2014-09-27 NOTE — Progress Notes (Signed)
STROKE TEAM PROGRESS NOTE   SUBJECTIVE (INTERVAL HISTORY) Her husband and sister are at the bedside.  Overall she feels her condition continues to decline. Repeat CT in am showed marked worsening with ICH, IVH and brain herniation, condition is not compatible with life. Showed family with current CT images and discussed with family at bedside regarding this devastating terminal condition. They are in deep sorrow and would like to have more family members visiting before withdraw.    OBJECTIVE Temp:  [93.4 F (34.1 C)-100.4 F (38 C)] 97.3 F (36.3 C) (04/03 1230) Pulse Rate:  [60-124] 118 (04/03 1230) Cardiac Rhythm:  [-] Sinus tachycardia (04/03 0800) Resp:  [15-32] 19 (04/03 1230) BP: (114-203)/(62-126) 158/99 mmHg (04/03 1230) SpO2:  [98 %-100 %] 100 % (04/03 1230) FiO2 (%):  [30 %] 30 % (04/03 1137)  No results for input(s): GLUCAP in the last 168 hours.  Recent Labs Lab 10/18/2014 1032 09/25/2014 1040 10/18/2014 1502 10/11/2014 2100 09/27/14 0200 09/27/14 0400 09/27/14 0830  NA 139 143 139 150* 157* 156* 155*  K 3.3* 3.1*  --   --   --  3.4*  --   CL 107 104  --   --   --  126*  --   CO2 26  --   --   --   --  22  --   GLUCOSE 130* 132*  --   --   --  137*  --   BUN 14 15  --   --   --  9  --   CREATININE 1.13* 1.00  --   --   --  1.09  --   CALCIUM 8.7  --   --   --   --  8.2*  --   MG  --   --   --   --   --  1.7  --   PHOS  --   --   --   --   --  1.3*  --     Recent Labs Lab 10/16/2014 1032  AST 25  ALT 10  ALKPHOS 62  BILITOT 0.5  PROT 6.6  ALBUMIN 3.7    Recent Labs Lab 09/29/2014 1032 10/14/2014 1040 09/27/14 0400  WBC 6.6  --  12.1*  NEUTROABS 3.5  --   --   HGB 14.6 16.0* 14.3  HCT 44.8 47.0* 43.1  MCV 89.1  --  87.8  PLT 163  --  162   No results for input(s): CKTOTAL, CKMB, CKMBINDEX, TROPONINI in the last 168 hours.  Recent Labs  10/03/2014 1032 09/27/14 0400  LABPROT 13.3 14.6  INR 1.00 1.13    Recent Labs  10/10/2014 1050  COLORURINE  YELLOW  LABSPEC 1.008  PHURINE 7.5  GLUCOSEU NEGATIVE  HGBUR NEGATIVE  BILIRUBINUR NEGATIVE  KETONESUR NEGATIVE  PROTEINUR 30*  UROBILINOGEN 0.2  NITRITE NEGATIVE  LEUKOCYTESUR NEGATIVE       Component Value Date/Time   CHOL 187 06/04/2011 0620   TRIG 59 06/04/2011 0620   HDL 62 06/04/2011 0620   CHOLHDL 3.0 06/04/2011 0620   VLDL 12 06/04/2011 0620   LDLCALC 113* 06/04/2011 0620   No results found for: HGBA1C    Component Value Date/Time   LABOPIA NONE DETECTED 10/11/2014 1050   COCAINSCRNUR NONE DETECTED 09/27/2014 1050   LABBENZ NONE DETECTED 09/27/2014 1050   AMPHETMU NONE DETECTED 10/13/2014 1050   THCU POSITIVE* 10/05/2014 1050   LABBARB NONE DETECTED 10/14/2014 1050     Recent  Labs Lab 10/01/2014 [place] <5    I have personally reviewed the radiological images below and agree with the radiology interpretations.  Ct Head Wo Contrast  09/27/2014    IMPRESSION: Interval placement of right ventriculostomy.  Marked worsening otherwise. Increase in the left thalamic hematoma to the size of 5.2 x 4.4 x 5.5 cm, volume of 60 mL. Marked increase in intraventricular blood. Ventricular dilatation. Worsening of left-to-right shift, now 1.8 cm. 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     Ct Head Wo Contrast  10/09/2014    IMPRESSION: Left thalamic hemorrhage with extensive intraventricular blood. Mild dilatation of the temporal horns.  Approximately 5 mL of midline shift.  Evidence for old ischemic changes.    Dg Chest Port 1 View  09/27/2014   IMPRESSION: No acute cardiopulmonary disease.    Dg Chest Port 1 View  10/16/2014    IMPRESSION: No pneumothorax following central line placement. The catheter tip is in satisfactory position.    Dg Chest Portable 1 View  10/11/2014   IMPRESSION: High endotracheal tube position, with tip approximately 7 cm above carina.  Mild left lung scarring.  No acute lung disease.     Carotid Doppler  Left  carotid artery duplex completed: 1-39% ICA stenosis. Vertebral artery flow is antegrade. Right carotid artery duplex not done due to line in the neck.   2D Echocardiogram  Cancelled due to withdraw of care.  EEG - This is an abnormal electroencephalogram secondary to burst suppression activity. Although this activity is seen throughout, it is slower over the right hemisphere consistent with the IVC drain placement and hemorrhage location. The burst suppression activity can be seenas a medication effect or in the post-ictal state.   PHYSICAL EXAM  Temp:  [93.4 F (34.1 C)-100.4 F (38 C)] 97.3 F (36.3 C) (04/03 1230) Pulse Rate:  [60-124] 118 (04/03 1230) Resp:  [15-32] 19 (04/03 1230) BP: (114-203)/(62-126) 158/99 mmHg (04/03 1230) SpO2:  [98 %-100 %] 100 % (04/03 1230) FiO2 (%):  [30 %] 30 % (04/03 1137)  General - Well nourished, well developed, intubated not on sedation, in deep coma.  Ophthalmologic - not attempted as family would like withdraw care.  Cardiovascular - Regular rhythm but tachycardia.  Neck - supple, no carotid bruits  Neuro - intubated, not on sedation, in deep coma, left pupil 4 mm, right pupil 2 mm, nonreactive to light, no doll's eyes, corneal reflex absent, weak gag and cough, breathing over the vent. No movement on pain stimulation on all extremities, deep tendon reflexes absent, Babinski absence.   ASSESSMENT/PLAN Margaret Mcmillan is a 59 y.o. female with history of hypertension not on medication, not in control was admitted for left basal ganglia ICH with ventricular extension. Symptoms worsened in ER, intubated. Put on Cardene drip, overnight patient condition deteriorated, repeat CT had in a.m. showed marked increasing of ICH and IVH, with significant midline shift with uncal herniation and transtentorial herniation.    ICH with ventricular extension:  Dominant left basal ganglia large hematoma with ventricular casting and uncal as well as  transtentorial herniation. Etiology likely due to an controlled hypertension. Neuro exam showed near brain death, prognosis is extremely poor.   CT head showed left basal ganglia ICH with ventricular extension, repeat CT in a.m. showed marked worsening of ICH and IVH, with significant midline shift as well as uncal herniation and transtentorial herniation. Not compatible with life.   Carotid Doppler unremarkable  2D Echo  canceled   SCDs for VTE prophylaxis  Diet NPO time specified   no antithrombotic prior to admission, now on no antithrombotic   Brain herniation and cerebral edema  CT repeat showed uncal herniation and transtentorial herniation  On 3% saline  Sodium on the goal 156  Neuro examination showed near brain death  Prognosis is extremely poor  Hypertension, malignant   Home meds: Not on BP meds for 2 years  Currently on Cardene drip blood pressure under control   Stable  Withdraw care  Had long discussion with husband and sister at bedside  Showed CT images and informed them regarding current neuro exam, grave condition, extremely poor prognosis and the near brain death situation.  Husband asks for more family members to visit  Likely withdraw after family visit   Hospital day # 1  This patient is critically ill due to large [place] with ventricular casting, brain herniation and at significant risk of neurological worsening, death form uncal herniation and transtentorial herniation. This patient's care requires constant monitoring of vital signs, hemodynamics, respiratory and cardiac monitoring, review of multiple databases, neurological assessment, discussion with family, other specialists and medical decision making of high complexity. I spent 55 minutes of neurocritical care time in the care of this patient.   Rosalin Hawking, MD PhD Stroke Neurology 09/27/2014 1:30 PM    To contact Stroke Continuity provider, please refer to [email or web address] After hours,  contact General Neurology

## 2014-09-30 NOTE — Discharge Summary (Signed)
Stroke Discharge Summary  Patient ID: Margaret Mcmillan   MRN: [id number]      DOB: 09/21/1955  Date of Admission: 10/19/2014 Date of Discharge: 09/30/2014  Attending Physician:  No att. providers found, Stroke MD  Consulting Physician(s):   Treatment Team:  Leeroy Cha, MD pulmonary/intensive care  Patient's PCP:  No primary care provider on file.  DISCHARGE DIAGNOSIS:  Active Problems:   ICH (intracerebral hemorrhage)   Nontraumatic subcortical hemorrhage of cerebral hemisphere  BMI: Body mass index is 18.94 kg/(m^2).  Past Medical History  Diagnosis Date  . Shoulder pain     Started at July, 2012  . Lipoma of shoulder   . Tobacco abuse     Since 59 year old  . Cocaine abuse   . Headache   . Arthritis left arm  . No pertinent past medical history   . COPD (chronic obstructive pulmonary disease) 09/02/2011  . Hypertension     takes Amlodipine,HCTZ,and Metoprolol daily  . Hyperlipidemia     takes Lipitor daily  . Dysrhythmia     takes Metoprolol daily  . Bronchitis     hx of  . Lung mass     left upper lobe  . Constipation     related to medications and takes OTC stoof softener PRN  . Blood transfusion 2001 or 2002  . CIGARETTE SMOKER 04/17/2006   Past Surgical History  Procedure Laterality Date  . Uterine fibroid surgery       at 2002 to 2003  . Fracture surgery  at age 49    left arm  . Lung lobectomy  10/20/2011    Left upper lobectomy for pulmonary nodules      Medication List    ASK your doctor about these medications        amLODipine 10 MG tablet  Commonly known as:  NORVASC  Take 1 tablet (10 mg total) by mouth daily.     atorvastatin 40 MG tablet  Commonly known as:  LIPITOR  Take 1 tablet (40 mg total) by mouth daily.     hydrochlorothiazide 25 MG tablet  Commonly known as:  HYDRODIURIL  TAKE ONE TABLET BY MOUTH EVERY DAY     metoprolol succinate 100 MG 24 hr tablet  Commonly known as:  TOPROL-XL  Take 50 mg by mouth daily. Take with  or immediately following a meal.     quinapril 40 MG tablet  Commonly known as:  ACCUPRIL  Take 1 tablet (40 mg total) by mouth 2 (two) times daily.        LABORATORY STUDIES CBC    Component Value Date/Time   WBC 12.1* 09/27/2014 0400   RBC 4.91 09/27/2014 0400   HGB 14.3 09/27/2014 0400   HCT 43.1 09/27/2014 0400   PLT 162 09/27/2014 0400   MCV 87.8 09/27/2014 0400   MCH 29.1 09/27/2014 0400   MCHC 33.2 09/27/2014 0400   RDW 15.5 09/27/2014 0400   LYMPHSABS 2.7 09/29/2014 1032   MONOABS 0.3 10/23/2014 1032   EOSABS 0.1 10/08/2014 1032   BASOSABS 0.1 10/23/2014 1032   CMP    Component Value Date/Time   NA 155* 09/27/2014 0830   K 3.4* 09/27/2014 0400   CL 126* 09/27/2014 0400   CO2 22 09/27/2014 0400   GLUCOSE 137* 09/27/2014 0400   BUN 9 09/27/2014 0400   CREATININE 1.09 09/27/2014 0400   CREATININE 1.13* 10/02/2011 1527   CALCIUM 8.2* 09/27/2014 0400   PROT 6.6  09/27/2014 1032   ALBUMIN 3.7 10/20/2014 1032   AST 25 10/16/2014 1032   ALT 10 10/06/2014 1032   ALKPHOS 62 10/18/2014 1032   BILITOT 0.5 10/14/2014 1032   GFRNONAA 55* 09/27/2014 0400   GFRNONAA 55* 10/02/2011 1527   GFRAA 64* 09/27/2014 0400   GFRAA 63 10/02/2011 1527   COAGS Lab Results  Component Value Date   INR 1.13 09/27/2014   INR 1.00 09/29/2014   INR 1.01 10/18/2011   Lipid Panel    Component Value Date/Time   CHOL 187 06/04/2011 0620   TRIG 59 06/04/2011 0620   HDL 62 06/04/2011 0620   CHOLHDL 3.0 06/04/2011 0620   VLDL 12 06/04/2011 0620   LDLCALC 113* 06/04/2011 0620   HgbA1C No results found for: HGBA1C Cardiac Panel (last 3 results) No results for input(s): CKTOTAL, CKMB, TROPONINI, RELINDX in the last 72 hours. Urinalysis    Component Value Date/Time   COLORURINE YELLOW 10/12/2014 1050   APPEARANCEUR CLEAR 09/25/2014 1050   LABSPEC 1.008 10/21/2014 1050   PHURINE 7.5 10/04/2014 1050   GLUCOSEU NEGATIVE 10/18/2014 1050   HGBUR NEGATIVE 10/21/2014 1050    BILIRUBINUR NEGATIVE 09/25/2014 1050   KETONESUR NEGATIVE 10/18/2014 1050   PROTEINUR 30* 10/16/2014 1050   UROBILINOGEN 0.2 10/02/2014 1050   NITRITE NEGATIVE 10/18/2014 1050   LEUKOCYTESUR NEGATIVE 10/14/2014 1050   Urine Drug Screen     Component Value Date/Time   LABOPIA NONE DETECTED 10/22/2014 1050   COCAINSCRNUR NONE DETECTED 09/29/2014 1050   LABBENZ NONE DETECTED 10/04/2014 1050   AMPHETMU NONE DETECTED 10/13/2014 1050   THCU POSITIVE* 10/20/2014 1050   LABBARB NONE DETECTED 10/04/2014 1050    Alcohol Level    Component Value Date/Time   ETH <5 10/14/2014 1032     SIGNIFICANT DIAGNOSTIC STUDIES Ct Head Wo Contrast  09/27/2014 IMPRESSION: Interval placement of right ventriculostomy. Marked worsening otherwise. Increase in the left thalamic hematoma to the size of 5.2 x 4.4 x 5.5 cm, volume of 60 mL. Marked increase in intraventricular blood. Ventricular dilatation. Worsening of left-to-right shift, now 1.8 cm. 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   Ct Head Wo Contrast  10/12/2014 IMPRESSION: Left thalamic hemorrhage with extensive intraventricular blood. Mild dilatation of the temporal horns. Approximately 5 mL of midline shift. Evidence for old ischemic changes.   Dg Chest Port 1 View  09/27/2014 IMPRESSION: No acute cardiopulmonary disease.   Dg Chest Port 1 View  10/18/2014 IMPRESSION: No pneumothorax following central line placement. The catheter tip is in satisfactory position.   Dg Chest Portable 1 View  10/19/2014 IMPRESSION: High endotracheal tube position, with tip approximately 7 cm above carina. Mild left lung scarring. No acute lung disease.   Carotid Doppler Left carotid artery duplex completed: 1-39% ICA stenosis. Vertebral artery flow is antegrade. Right carotid artery duplex not done due to line in the neck.   2D Echocardiogram Cancelled due to withdraw of care.  EEG - This is an  abnormal electroencephalogram secondary to burst suppression activity. Although this activity is seen throughout, it is slower over the right hemisphere consistent with the IVC drain placement and hemorrhage location. The burst suppression activity can be seenas a medication effect or in the post-ictal state.    HISTORY OF PRESENT ILLNESS Margaret Lingenfelter is an 59 y.o. female who awakened normal. Was up doing chores around the house and had a sudden collapse. Patient became nonverbal and was noted to have right sided weakness. EMS was  called at that time and patient was brought in as a code stroke. Patient had not taken her antihypertensive medications for about 2 years. Required multiple meds with only moderate control prior to that.   Date last known well: Date: 10/07/2014 Time last known well: Time: 09:50 tPA Given: No: [place] In ER, her condition deteriorated and she became less responsive with N/V. She was intubated for airway protection. Hypertonic saline initiated and NSG Dr. Joya Salm consulted and EVD placed for early hydrocephalus. She was admitted to neuro ICU. Repeat CT showed marked worsening with ICH, IVH and brain herniation, condition is not compatible with life. Neuro exam showed left pupil ballooning and no pupillary or corneal reflex and only weak gag and breathing over the vent. Showed family with CT images and discussed with family at bedside regarding this devastating terminal condition. Family would like withdraw care and comfort care. Pt was put on comfort care measure and she passed away on 2014/10/04 9:09am.    Rosalin Hawking, MD PhD Stroke Neurology 09/30/2014 10:23 AM

## 2014-10-25 NOTE — Clinical Social Work Note (Signed)
Clinical Social Worker received referral for comfort care support for family who is withdrawing support from patient.  CSW has spoken with RN regarding patient family.  Patient family has already built rapport/relationship with Mable Fill who will continue to follow patient family through process.  RN to notify CSW if further needs arise following patient death.  CSW remains available for outside support to family and staff as needed.  Barbette Or, [place]

## 2014-10-25 NOTE — Progress Notes (Signed)
Chaplain referred to family from on-call chaplain. Chaplain introduced herself to pt family, including pt husband and daughter. Chaplain facilitated storytelling and provided emotional support. Family seems to be strong, supportive, and at peace with pt passing. Pt daughter informed chaplain that more family will be coming. Chaplain will continue to follow. Page chaplain upon family request.   09/30/14 0900  Clinical Encounter Type  Visited With Family;Health care provider  Visit Type Death;Spiritual support  Referral From Chaplain  Spiritual Encounters  Spiritual Needs Emotional;Prayer  Stress Factors  Family Stress Factors Loss  Zayvien Canning, Barbette Hair, Chaplain 09/30/2014 9:53 AM

## 2014-10-25 NOTE — Progress Notes (Addendum)
Morphine drip 240 cc (concen. 1 mg/mL) wasted in sink with Gilford Rile RN as witness.

## 2014-10-25 NOTE — Progress Notes (Signed)
UR completed.  Emmerie Battaglia, RN BSN [facility] CCM Trauma/Neuro ICU Case Manager [phone]  

## 2014-10-25 NOTE — Progress Notes (Addendum)
Apnea and asystole noted on cardiac monitor. No respirations or heart sounds auscultated. Two RNs verified, Hendricks Milo RN & Gerline Legacy RN. Time of death called at 09:09 AM. Family at bedside. Emotional support given. CDS & ME notified. Chaplain also came by spend time with family.

## 2014-10-25 DEATH — deceased
